# Patient Record
Sex: Female | Born: 1960
Health system: Southern US, Community
[De-identification: ages and names within clinical notes are randomized; demographics above are authoritative.]

## PROBLEM LIST (undated history)

## (undated) DIAGNOSIS — I10 Essential (primary) hypertension: Secondary | ICD-10-CM

## (undated) DIAGNOSIS — F419 Anxiety disorder, unspecified: Secondary | ICD-10-CM

## (undated) DIAGNOSIS — K602 Anal fissure, unspecified: Secondary | ICD-10-CM

## (undated) DIAGNOSIS — G5603 Carpal tunnel syndrome, bilateral upper limbs: Secondary | ICD-10-CM

## (undated) DIAGNOSIS — E785 Hyperlipidemia, unspecified: Secondary | ICD-10-CM

## (undated) DIAGNOSIS — T8859XA Other complications of anesthesia, initial encounter: Secondary | ICD-10-CM

## (undated) DIAGNOSIS — C541 Malignant neoplasm of endometrium: Secondary | ICD-10-CM

## (undated) HISTORY — DX: Carpal tunnel syndrome, bilateral upper limbs: G56.03

## (undated) HISTORY — DX: Anal fissure, unspecified: K60.2

## (undated) HISTORY — DX: Hyperlipidemia, unspecified: E78.5

## (undated) HISTORY — PX: CARPAL TUNNEL RELEASE: SHX101

---

## 2002-02-12 ENCOUNTER — Ambulatory Visit (HOSPITAL_COMMUNITY): Admission: RE | Admit: 2002-02-12 | Discharge: 2002-02-12 | Payer: Self-pay | Admitting: Obstetrics and Gynecology

## 2002-02-12 ENCOUNTER — Encounter: Payer: Self-pay | Admitting: Obstetrics and Gynecology

## 2003-02-18 ENCOUNTER — Encounter: Payer: Self-pay | Admitting: Obstetrics and Gynecology

## 2003-02-18 ENCOUNTER — Ambulatory Visit (HOSPITAL_COMMUNITY): Admission: RE | Admit: 2003-02-18 | Discharge: 2003-02-18 | Payer: Self-pay | Admitting: Obstetrics and Gynecology

## 2004-03-23 ENCOUNTER — Ambulatory Visit (HOSPITAL_COMMUNITY): Admission: RE | Admit: 2004-03-23 | Discharge: 2004-03-23 | Payer: Self-pay | Admitting: Obstetrics and Gynecology

## 2005-04-17 ENCOUNTER — Ambulatory Visit (HOSPITAL_COMMUNITY): Admission: RE | Admit: 2005-04-17 | Discharge: 2005-04-17 | Payer: Self-pay | Admitting: Family Medicine

## 2006-04-18 ENCOUNTER — Ambulatory Visit (HOSPITAL_COMMUNITY): Admission: RE | Admit: 2006-04-18 | Discharge: 2006-04-18 | Payer: Self-pay | Admitting: Family Medicine

## 2007-02-11 ENCOUNTER — Ambulatory Visit: Payer: Self-pay | Admitting: Internal Medicine

## 2007-05-05 ENCOUNTER — Ambulatory Visit (HOSPITAL_COMMUNITY): Admission: RE | Admit: 2007-05-05 | Discharge: 2007-05-05 | Payer: Self-pay | Admitting: Family Medicine

## 2007-05-20 ENCOUNTER — Ambulatory Visit (HOSPITAL_COMMUNITY): Admission: RE | Admit: 2007-05-20 | Discharge: 2007-05-20 | Payer: Self-pay | Admitting: Gastroenterology

## 2007-05-20 ENCOUNTER — Ambulatory Visit: Payer: Self-pay | Admitting: Gastroenterology

## 2007-05-20 HISTORY — PX: COLONOSCOPY: SHX174

## 2008-01-02 ENCOUNTER — Ambulatory Visit: Payer: Self-pay | Admitting: Gastroenterology

## 2008-02-17 ENCOUNTER — Ambulatory Visit: Payer: Self-pay | Admitting: Gastroenterology

## 2008-04-15 ENCOUNTER — Ambulatory Visit: Payer: Self-pay | Admitting: Gastroenterology

## 2008-05-13 ENCOUNTER — Ambulatory Visit (HOSPITAL_COMMUNITY): Admission: RE | Admit: 2008-05-13 | Discharge: 2008-05-13 | Payer: Self-pay | Admitting: Family Medicine

## 2009-05-11 ENCOUNTER — Encounter: Payer: Self-pay | Admitting: Gastroenterology

## 2009-05-24 ENCOUNTER — Ambulatory Visit (HOSPITAL_COMMUNITY): Admission: RE | Admit: 2009-05-24 | Discharge: 2009-05-24 | Payer: Self-pay | Admitting: Family Medicine

## 2010-05-22 ENCOUNTER — Encounter (INDEPENDENT_AMBULATORY_CARE_PROVIDER_SITE_OTHER): Payer: Self-pay | Admitting: *Deleted

## 2010-05-25 ENCOUNTER — Ambulatory Visit (HOSPITAL_COMMUNITY): Admission: RE | Admit: 2010-05-25 | Discharge: 2010-05-25 | Payer: Self-pay | Admitting: Family Medicine

## 2010-06-12 ENCOUNTER — Ambulatory Visit: Payer: Self-pay | Admitting: Gastroenterology

## 2010-06-12 DIAGNOSIS — K602 Anal fissure, unspecified: Secondary | ICD-10-CM

## 2010-11-28 NOTE — Assessment & Plan Note (Signed)
Summary: ANAL FISSURE   Visit Type:  Follow-up Visit Primary Care Yalissa Fink:  Regino Schultze, M.D.  Chief Complaint:  RECTAL PAIN.  History of Present Illness: Doing a lot better with the fisuure: itching or irritated. Last week kinda constipated-skipped a whole day and had a hard stool and bleeding a litttle. NTG helps and uses as needed. Rx since 2009. Getting better. Done really well. Uses dicyclomine 1-2 times a day. If she misses a dose, she does notice a difference.  Uses Sitz.  Current Medications (verified): 1)  Dicyclomine Hcl 10 Mg Caps (Dicyclomine Hcl) .... One By Mouth Two Times A Day Prn 2)  Hydrochlorothiazide 25 Mg Tabs (Hydrochlorothiazide) .... Take 1 Tablet By Mouth Once A Day 3)  Lisinopril 10 Mg Tabs (Lisinopril) .... Take 1 Tablet By Mouth Once A Day 4)  Simvastatin 40 Mg Tabs (Simvastatin) .... Take 1 Tablet By Mouth Once A Day 5)  Aspirin 81 Mg Tbec (Aspirin) .... Take 1 Tablet By Mouth Once A Day 6)  Stool Softners .... Two Tablets At Night 7)  Hydrocodone .... As Needed 8)  Alprazolam 0.5 Mg Tabs (Alprazolam) .... As Needed  Allergies (verified): No Known Drug Allergies  Past History:  Past Medical History: RECTAL BLEEDING-ANAL FISSURE **TCS 2008: WNLS Hyperlipidemia Carpal Tunnel Syndrome  Past Surgical History: Carpal Tunnel Release  Family History: No FH of Colon Cancer or polyps  Social History: Married No bio children  Review of Systems       2008: 238 lbs  Vital Signs:  Patient profile:   50 year old female Height:      64 inches Weight:      232 pounds BMI:     39.97 Temp:     97.3 degrees F oral Pulse rate:   84 / minute BP sitting:   130 / 82  (left arm) Cuff size:   large  Vitals Entered By: Cloria Spring LPN (June 12, 2010 9:06 AM)  Physical Exam  General:  Well developed, well nourished, no acute distress. Head:  Normocephalic and atraumatic. Eyes:  PERRLA, no icterus. Mouth:  No deformity or lesions. Neck:  Supple; no  masses. Lungs:  Clear throughout to auscultation. Heart:  Regular rate and rhythm; no murmurs. Abdomen:  Soft, nontender and nondistended. Normal bowel sounds.  Impression & Recommendations:  Problem # 1:  ANAL FISSURE (ICD-565.0)  OPV in 24 mos. Will refill meds for 2 years w/o an appt. Refilled NTG ointment and Bentyl.  CC: PCP  Orders: Est. Patient Level II (81191) Prescriptions: NITROGLYCERIN 0.2% TOP two times a day as needed RECTAL PAIN  #1 x 11   Entered and Authorized by:   West Bali MD   Signed by:   West Bali MD on 06/12/2010   Method used:   Faxed to ...       [facility]* (retail)       [address]       [place], [place]  [address]       Ph: [phone]       Fax: [phone]   RxID:   [id number] DICYCLOMINE HCL 10 MG CAPS (DICYCLOMINE HCL) one by mouth two times a day  #60 x 11   Entered and Authorized by:   West Bali MD   Signed by:   West Bali MD on 06/12/2010   Method used:   Telephoned to [facility] [address] (retail)       [address]       [place], [place]  [address]       Ph: [phone]       Fax:    RxID:   [id number]   Appended Document: ANAL FISSURE 24 MONTH OPV IS IN THE COMPUTER

## 2010-11-28 NOTE — Letter (Signed)
Summary: Recall Office Visit  [facility]  [address]   [place], [place] [address]   Phone: [phone]  Fax: [phone]      May 22, 2010   [facility] [address] [place], [place]  [address] [birth year]   Dear [name],   According to our records, it is time for you to schedule a follow-up office visit with [place].   At your convenience, please call [phone] to schedule an office visit. If you have any questions, concerns, or feel that this letter is in error, we would appreciate your call.   Sincerely,    [name]  [facility] Ph: [phone]   Fax: [phone]  Appended Document: Recall Office Visit mailed pt a letter to contact us to make appt in order to further her refills

## 2011-03-13 NOTE — Assessment & Plan Note (Signed)
NAMEFAVEN, WATTERSON                CHART#:  [id number]   DATE:  02/17/2008                       DOB:  1961/01/01   REFERRING Sohil Timko:  Kirk Ruths, M.D.   PROBLEM LIST:  1. Rectal bleeding in 2008 likely secondary to anal fissure.  2. Rectal discomfort is likely secondary to anal fissure.  3. Hyperlipidemia.  4. Carpal tunnel syndrome status post carpal tunnel surgery.   SUBJECTIVE:  Ms. Madkins is a 50 year old female who was last seen in  March 2009.  She was having loose stools.  She was having frequent  rectal urgency.  She was also continuing to use her nitroglycerin in  rectal suppositories.  She states the gas is better and the fissures  are trying to heal.  She uses two Bentyl a day and the rumbling is  better.  Rarely does she have stomach pain.  She still has some soreness  around the rectum and slight pinkish discharge on the stool.  She  occasionally feels like she has spasm in her rectum.  She has only used  the nitroglycerin three times since her last visit.  She has used two  sets of suppositories since her last visit.  When she uses a sitz bath  she uses a blow dryer her on her rectum and then apply the nitroglycerin  ointment.  Her stools are more soft than not.  The Citrucel is not  causing any excess gas.   MEDICATIONS:  1. Lisinopril.  2. HCTZ.  3. Alprazolam.  4. Simvastatin.  5. Hydrocodone/APAP 7.5/650 as needed.  6. Stool softener daily.  7. Aspirin daily.  8. Hydrocortisone AC 25 mg suppositories as needed.  9. Nitroglycerin ointment to the rectum as needed.  10.Bentyl 10 mg twice daily.   OBJECTIVE:  VITAL SIGNS:  Weight 239 pounds (unchanged since March  2009), height 5 feet 4 inches, BMI 41.2 (morbidly obese).  Temperature  97.6, blood pressure 126/84, pulse 88.  GENERAL:  She is in no apparent  distress.  Alert and orient times four.  LUNGS:  Clear to auscultation bilaterally.CARDIOVASCULAR:  Regular  rhythm.  No murmur.  ABDOMEN:   Bowel sounds present, soft, nontender,  nondistended.   ASSESSMENT:  Ms. Mcanelly is a 50 year old female who may have anal  fissure which is causing rectal discomfort and intermittent rectal  bleeding.  She seems to have symptomatically improved with Citrucel and  Bentyl and decreasing her stool softener.  She continues to use  suppositories and nitroglycerin as needed.  Thank you for allowing me to  see Ms. Odekirk in consultation.  My recommendations follow.   RECOMMENDATIONS:  1. She should continue the Bentyl, Citrucel and stool softeners.  She      may use the suppositories nitroglycerin as needed.  2. She has follow-up appointment to see me in two months.  She      currently declined a surgical evaluation for now.  3. She is also given a prescription for donut pillow so that when she      is sitting for prolonged period of time her rectum can be more      comfortable.       Kassie Mends, M.D.  Electronically Signed     SM/MEDQ  D:  02/17/2008  T:  02/17/2008  Job:  [id number]  cc:   Kirk Ruths, M.D.

## 2011-03-13 NOTE — Op Note (Signed)
NAMEPATRECE, Victoria               ACCOUNT NO.:  [id number]   MEDICAL RECORD NO.:  [id number]          PATIENT TYPE:  AMB   LOCATION:  DAY                           FACILITY:  [facility]   PHYSICIAN:  Kassie Mends, M.D.      DATE OF BIRTH:  May 20, 1961   DATE OF PROCEDURE:  05/20/2007  DATE OF DISCHARGE:                               OPERATIVE REPORT   REFERRING PHYSICIAN:  Kirk Ruths, M.D.   PROCEDURE:  Colonoscopy.   INDICATION FOR EXAM:  [name] is a 50 year old female who presents  with rectal bleeding in April 2008.  She noticed a small volume of blood  on the stool and was also having pain in her rectum.  She has a  significant past medical history of constipation.  She was given Anusol  twice daily and nitroglycerin twice daily as needed.   FINDINGS:  1. Normal colon without evidence of polyps, masses, inflammatory      changes, diverticula or AVMs.  2. Normal retroflexed view of the rectum.   RECOMMENDATIONS:  1. Screening colonoscopy in 10 years because her first second-degree      relative had colon cancer at an age greater than 60.  2. High-fiber diet.  She is given a handout on high-fiber diet and      anal fissures.  3. Increase stool softener to twice daily to avoid hard bowel      movements and straining with bowel movements.  4. Drink 6-8 cups of water daily.   MEDICATIONS:  1. Demerol 125 mg IV.  2. Versed 5 mg IV.  3. Phenergan 12.5 mg IV.   PROCEDURE TECHNIQUE:  Physical exam was performed and informed consent  was obtained from the patient explaining the benefits, risks and  alternatives to the procedure.  The patient was connected to the monitor  and placed in the left lateral position.  Continuous oxygen was provided  by nasal cannula and IV medicine administered through an indwelling  cannula.  After administration of sedation and rectal exam, the  patient's rectum was intubated  and the scope was advanced under direct visualization sedation to  the  cecum.  The scope was removed slowly by careful examining the color,  texture, anatomy and integrity of the mucosa on the way out.  The  patient was recovered in endoscopy and discharged home in satisfactory  condition.      Kassie Mends, M.D.  Electronically Signed     SM/MEDQ  D:  05/20/2007  T:  05/20/2007  Job:  [id number]   cc:   Kirk Ruths, M.D.  Fax: [phone]

## 2011-03-13 NOTE — Assessment & Plan Note (Signed)
NAMEAVEAH, CASTELL                CHART#:  [id number]   DATE:  01/02/2008                       DOB:  February 18, 1961   REFERRING Jozeph Persing:  Dr. Karleen Hampshire   PROBLEM LIST:  1. Rectal bleeding in 2008 likely secondary to anal fissure.  2. Hyperlipidemia.  3. Carpal tunnel syndrome status post carpal tunnel surgery.   SUBJECTIVE:  Ms. Trefz is a 50 year old female who presents as a  return patient visit.  She was last seen by me in July of 2008.  She  presented with rectal bleeding, and a colonoscopy revealed no obvious  etiology for the rectal bleeding.  She states that her anal fissure  almost went away and then it recurred.  She was having lots of gas and  going to the bathroom and irritation.  It does not hurt stool to pass  her anus the first time but after the third time she gets burning and  itching.  Her stool is different.  Currently it is loose and little bits  at a time.  She has a significant amount of stress related to her  grandmother.  She has increased the fiber in her diet by eating fiber  bars.  She drinks a lot of water.  She has the urge to go to the  bathroom a lot.  She is not working right now.  She had not had any  suppositories in 3 to 4 days.  She used them as needed.  She is still  using the nitroglycerin ointment only 3 times a day but it gives her a  headache.  She is trying to exercise.   MEDICATIONS:  1. Lisinopril.  2. Hydrochlorothiazide.  3. Alprazolam.  4. Simvastatin.  5. Hydrocodone/acetaminophen.  6. Stool softener 2 a day.  7. Aspirin 81 mg daily.  8. Nystatin/Triamcinolone as needed.  9. Hydrocort AC 25 mg.  10.Rectal suppositories as needed.  11.Nitroglycerin ointment as needed.   OBJECTIVE:  Weight 240 pounds (unchanged since April of 2008).  Height 5  feet 4 inches.  BMI 41.2 (morbidly obese).  Temperature 98.1.  Blood  pressure 130/80.  Pulse 80.  GENERAL:  She is in no apparent distress.  Alert and oriented x4.  HEENT:   Atraumatic, normocephalic.  Pupils are equal and reactive to  light.  Mouth, no oral lesions. NECK:  Full range of motion and no  lymphadenopathy. LUNGS:  Clear to auscultation bilaterally.  CARDIOVASCULAR:  Regular rate.  No murmur, normal S1, S2. ABDOMEN:  Bowel sounds are present.  Soft, nontender, nondistended.  No rebound or  guarding.   ASSESSMENT:  Ms. Lemaster is a 50 year old female with intermittent  rectal discomfort and rectal bleeding.  She may have anal fissures or  hemorrhoids.  It responds to suppositories and nitroglycerin ointment.  Thank you for allowing me to see Ms. Coppess in consultation.  My  recommendations follow.   RECOMMENDATIONS:  1. She is to decrease her stool softener to 1 q.h.s.  She is to add      fiber supplement.  She is to add Bentyl 10 mg at 6 a.m.  If her      bowel movements are not improved after 1 week, then she is to      increase the Bentyl to twice a day at 6 a.m. and at  noon.  2. She may use the suppositories, nitroglycerin as needed.  3. She is to take Citrucel once a day for a week.  If she has no      bloating after 1 week, then she is to increase twice a day.  She is      given her discharge instructions in writing.  4. She has a return visit in 6 weeks.       Kassie Mends, M.D.  Electronically Signed     SM/MEDQ  D:  01/05/2008  T:  01/05/2008  Job:  [id number]   cc:   Kirk Ruths, M.D.

## 2011-03-13 NOTE — Assessment & Plan Note (Signed)
[name], [name]                CHART#:  [id number]   DATE:  04/15/2008                       DOB:  1961-09-10   REFERRING PHYSICIAN:  Kirk Rhodes, M.D.   PROBLEM LIST:  1. Rectal bleeding and discomfort likely secondary to anal fissure.      The differential diagnosis includes hemorrhoids.  2. Hyperlipidemia.  3. Carpal tunnel syndrome status post carpal tunnel surgery.   SUBJECTIVE:  The patient is a 50 year old female who presents as a  return patient visit.  She was last seen in April 2009.  She had a  colonoscopy in September 2008, which did not show any obvious evidence  of hemorrhoids.  She returned in March 2009, for rectal bleeding and was  thought to be due to an anal fissure.  She was asked to use stool  softeners and Bentyl, hydrocortisone suppositories and nitroglycerin  ointment as needed.  She is also asked to add fiber.  She was seen in  followup in April 2009, and her symptoms were improved with Citrucel,  Bentyl, and decrease in her stool softeners.  She was still requiring  the use of suppositories and nitroglycerin.   She presents today with improvement over the last 4 weeks.  Her bowel  movements are better.  Occasionally, she has some itching and stinging  in her rectum, but things are so much better.  She has only used  nitroglycerin 1 to 2 times in the last 4 weeks.  When she feels the  tightness, she uses it and that helps.  She is using the stool softeners  too at nighttime, but if her stools are too loose then she decreases  this to once a day.  She is still using her Bentyl twice a day.  She is  having one, sometimes two, normal sausage-like stools a day.  She is  using her Citrucel powder 1 to 2 times a day and this is not causing any  bloating.   MEDICATIONS:  1. Lisinopril.  2. HCTZ.  3. Alprazolam.  4. Simvastatin.  5. Hydrocodone/APAP.  6. Stool softener 2 nightly.  7. Aspirin 81 mg daily.  8. Nystatin/triamcinolone.  9.  Hydrocortisone AC suppositories 25 mg as needed.  10.Nitroglycerin ointment as needed per her rectum.  11.Bentyl 10 mg b.i.d.  12.Citrucel powder 1 to 2 times a day.   OBJECTIVE:   PHYSICAL EXAMINATION:  VITALS:  Weight 231 pounds (down 8 pounds since  April 2009), height 5 feet 4 inches, temperature 98.9, blood pressure  128/88, and pulse 88.  GENERAL:  She is in no apparent distress.  Alert and oriented x4.LUNGS:  Clear to auscultation bilaterally.CARDIOVASCULAR:  Regular rhythm  without murmur.  ABDOMEN:  Bowel sounds are present, soft, nontender, and nondistended.   ASSESSMENT:  This patient is a 50 year old female who likely has  constipation predominant irritable bowel syndrome, which has responded  nicely anticholinergics and fiber.  Thank you for allowing me to see  this patient in consultation.  My recommendations follow.   RECOMMENDATIONS:  She should continue the Bentyl, Citrucel, and  nitroglycerin ointment as needed and stool softeners.  She has a follow  up appointment to see me in 4 months.       Kassie Mends, M.D.  Electronically Signed     SM/MEDQ  D:  04/15/2008  T:  04/16/2008  Job:  [id number]   cc:   [name], M.D.

## 2011-03-16 NOTE — Consult Note (Signed)
NAME:  [name], [name] NO.:  [id number]   MEDICAL RECORD NO.:  [id number]                  PATIENT TYPE:   LOCATION:                                       FACILITY:  [facility]   PHYSICIAN:  Lionel December, M.D.                 DATE OF BIRTH:  27-Feb-1961   DATE OF CONSULTATION:  04/30/2003  DATE OF DISCHARGE:                                   CONSULTATION   REPORT TITLE:  GASTROENTEROLOGY CONSULTATION   CHIEF COMPLAINT:  Heme-positive stools.   HISTORY OF PRESENT ILLNESS:  [name] is a 50 year old white female who  presents to our office today stating that she had a gynecological exam on  April 09, 2003, by [name].  At that point in time, Hemoccult was  obtained and she was found to be heme positive.  She was followed up with  three Hemoccult cards which all returned positive.  She states that she is  also seeing a small amount of pinkish blood post bowel movements.  She also  complains of some occasional intermittent burning and itching to her rectal  area.  She has no known history of hemorrhoids.  This has been going on for  approximately the last 2-3 months.  She also reports daily bowel movements  which are soft and brown.  She denies any history of diarrhea or  constipation.   She does have a positive family history for her maternal grandmother with  colon carcinoma in her 103s.  She also complains of occasional heartburn and  reflux which is relieved nicely with Pepcid.  She denies any dysphagia or  odynophagia.  She denies any history of peptic ulcer disease.  She denies  any history of Crohn's or ulcerative colitis.   PAST MEDICAL HISTORY:  Elevated cholesterol.   MEDICATIONS:  1. Xanax 0.5 mg p.o. p.r.n.  2. Pepcid p.r.n.  3. Anti-inflammatory p.r.n. for carpal tunnel syndrome.   ALLERGIES:  No known drug allergies.   FAMILY HISTORY:  Positive for maternal grandmother with colon carcinoma  (70s);  otherwise, unremarkable.   She has two sisters and one brother  without health problems.   SOCIAL HISTORY:  She has been married x22 years.  She is nulliparous.  She  is employed by [employer] full time; however, they are closing down in  a few days.  She will be unemployed at that time point in time.  She has a  history of smoking 1/2 pack per day for the last ten years.  She denies any  alcohol or drug use.   REVIEW OF SYSTEMS:  CONSTITUTIONAL:  She reports stable weight, appetite and  energy level.  She denies any fever or chills.  CARDIOVASCULAR:  She denies  any shortness of breath or chest pain.  GASTROINTESTINAL:  See HPI.  SKIN:  She denies any jaundice or rash.   PHYSICAL EXAMINATION:  GENERAL:  Well-developed, well-nourished, white  female in no acute distress.  SKIN:  Warm, pink and dry without any lesions.  No jaundice.  HEENT:  Sclerae clear, nonicteric.  Conjunctivae pink.  Oropharynx pink and  moist without any lesions.  CHEST:  Heart regular, rate and rhythm with murmur.  LUNGS:  Clear to auscultation bilaterally.  ABDOMEN:  Obese.  Positive bowel sounds x 4.  Soft, nontender, nondistended.  No organomegaly.  EXTREMITIES:  Pedal pulses 2+.  No pedal edema.   ASSESSMENT:  [name] is a 50 year old white female who presents with  heme-positive stools.  She also has a history of intermittent hematochezia  of small volume.  Given her family history of colon cancer, I feel that it  would be most appropriate at this time to proceed with a colonoscopy.  Ms.  Rhodes was explained the procedure, the risks and the benefits and she  agrees with this plan.   PLAN:  1. Colonoscopy was explained to the patient and the patient agrees with this     plan at this time.  We will prophylactically give her antibiotics     secondary to her heart murmur.  2. She is encouraged to follow up with Dr. Regino Schultze regarding this heart     murmur which has not been worked up.  3. Will also attempt to obtain a  recent hemoglobin and hematocrit from Dr.     Edison Simon office as she believes that she has had one drawn recently.  If     not, we may consider getting a hemoglobin and hematocrit.  4. We will follow up postprocedure or sooner if needed.     Nicholas Lose, N.P.                 Lionel December, M.D.    KC/MEDQ  D:  04/30/2003  T:  04/30/2003  Job:  [id number]   cc:   Tilda Burrow, M.D.  [address] [place]  [place] [address]  Fax: [phone]   Kirk Ruths, M.D.  [address]  [place]  [place] [address]  Fax: [phone]

## 2011-04-11 ENCOUNTER — Ambulatory Visit: Payer: Self-pay | Admitting: Gastroenterology

## 2011-04-23 ENCOUNTER — Other Ambulatory Visit (HOSPITAL_COMMUNITY): Payer: Self-pay | Admitting: Family Medicine

## 2011-04-23 DIAGNOSIS — Z139 Encounter for screening, unspecified: Secondary | ICD-10-CM

## 2011-05-28 ENCOUNTER — Ambulatory Visit (HOSPITAL_COMMUNITY)
Admission: RE | Admit: 2011-05-28 | Discharge: 2011-05-28 | Disposition: A | Discharge status: Home / Self Care | Payer: Medicare PPO | Source: Ambulatory Visit | Attending: Family Medicine | Admitting: Family Medicine

## 2011-05-28 DIAGNOSIS — Z139 Encounter for screening, unspecified: Secondary | ICD-10-CM

## 2011-05-28 DIAGNOSIS — Z1231 Encounter for screening mammogram for malignant neoplasm of breast: Secondary | ICD-10-CM | POA: Insufficient documentation

## 2012-05-08 ENCOUNTER — Other Ambulatory Visit (HOSPITAL_COMMUNITY): Payer: Self-pay | Admitting: Family Medicine

## 2012-05-08 DIAGNOSIS — Z139 Encounter for screening, unspecified: Secondary | ICD-10-CM

## 2012-05-19 ENCOUNTER — Encounter: Payer: Self-pay | Admitting: Gastroenterology

## 2012-05-29 ENCOUNTER — Ambulatory Visit (HOSPITAL_COMMUNITY)
Admission: RE | Admit: 2012-05-29 | Discharge: 2012-05-29 | Disposition: A | Discharge status: Home / Self Care | Payer: Medicare PPO | Source: Ambulatory Visit | Attending: Family Medicine | Admitting: Family Medicine

## 2012-05-29 DIAGNOSIS — Z1231 Encounter for screening mammogram for malignant neoplasm of breast: Secondary | ICD-10-CM | POA: Insufficient documentation

## 2012-05-29 DIAGNOSIS — Z139 Encounter for screening, unspecified: Secondary | ICD-10-CM

## 2012-06-03 ENCOUNTER — Other Ambulatory Visit: Payer: Self-pay | Admitting: Family Medicine

## 2012-06-03 DIAGNOSIS — R928 Other abnormal and inconclusive findings on diagnostic imaging of breast: Secondary | ICD-10-CM

## 2012-06-11 ENCOUNTER — Other Ambulatory Visit: Payer: Self-pay | Admitting: Family Medicine

## 2012-06-11 ENCOUNTER — Ambulatory Visit (HOSPITAL_COMMUNITY)
Admission: RE | Admit: 2012-06-11 | Discharge: 2012-06-11 | Disposition: A | Discharge status: Home / Self Care | Payer: Medicare PPO | Source: Ambulatory Visit | Attending: Family Medicine | Admitting: Family Medicine

## 2012-06-11 DIAGNOSIS — R928 Other abnormal and inconclusive findings on diagnostic imaging of breast: Secondary | ICD-10-CM | POA: Insufficient documentation

## 2012-06-11 DIAGNOSIS — R921 Mammographic calcification found on diagnostic imaging of breast: Secondary | ICD-10-CM

## 2012-06-18 ENCOUNTER — Ambulatory Visit
Admission: RE | Admit: 2012-06-18 | Discharge: 2012-06-18 | Disposition: A | Discharge status: Home / Self Care | Payer: Medicare PPO | Source: Ambulatory Visit | Attending: Family Medicine | Admitting: Family Medicine

## 2012-06-18 DIAGNOSIS — R921 Mammographic calcification found on diagnostic imaging of breast: Secondary | ICD-10-CM

## 2012-06-23 HISTORY — PX: BREAST BIOPSY: SHX20

## 2013-03-03 ENCOUNTER — Encounter: Payer: Self-pay | Admitting: Gastroenterology

## 2013-03-05 ENCOUNTER — Ambulatory Visit (INDEPENDENT_AMBULATORY_CARE_PROVIDER_SITE_OTHER): Payer: Medicare PPO | Admitting: Gastroenterology

## 2013-03-05 ENCOUNTER — Encounter: Payer: Self-pay | Admitting: Gastroenterology

## 2013-03-05 VITALS — BP 138/84 | HR 96 | Temp 98.2°F | Ht 64.0 in | Wt 247.2 lb

## 2013-03-05 DIAGNOSIS — K602 Anal fissure, unspecified: Secondary | ICD-10-CM

## 2013-03-05 MED ORDER — DICYCLOMINE HCL 10 MG PO CAPS: ORAL_CAPSULE | ORAL | Status: DC

## 2013-03-05 MED ORDER — NYSTATIN 100000 UNIT/GM EX POWD: Freq: Four times a day (QID) | CUTANEOUS | Status: DC

## 2013-03-05 NOTE — Progress Notes (Signed)
CC PCP 

## 2013-03-05 NOTE — Assessment & Plan Note (Signed)
SX FLARE WITH INTERMITTENT CHANGE IN BOWEL HABITS. WATERY STOOLS RESPOND TO DICYCLOMINE. RECTAL ITCHING MAY BE DUE TO YEAST OVERGROWTH OR MILD FLARE OF HEMORRHOIDS.  DRINK WATER TO KEEP YOUR URINE LIGHT YELLOW. EAT FIBER NTG OINTMENT AS NEEDED DICYCLOMINE AS NEEDED FOR WATERY STOOLS COLACE 1-2 AT BEDTIME TO PREVENT CONSTIPATION NYSTATIN CREAM TO PERIANAL AREA AS NEEDED FOR RECTAL ITCHING OPV IN 3 MOS

## 2013-03-05 NOTE — Progress Notes (Signed)
Reminder in epic °

## 2013-03-05 NOTE — Patient Instructions (Signed)
DRINK WATER TO KEEP YOUR URINE LIGHT YELLOW. FOLLOW A HIGH FIBER/LOW FAT DIET.  AVOID ITEMS THAT CAUSE BLOATING & GAS. SEE INFO BELOW.   USE NITROGLYCERIN OINTMENT AS NEEDED USE DICYCLOMINE AS NEEDED FOR WATERY STOOLS. USE COLACE 1-2 AT BEDTIME TO PREVENT CONSTIPATION. NYSTATIN CREAM TO PERIANAL AREA AS NEEDED FOR RECTAL ITCHING  FOLLOW UP IN 3 MOS.    Low-Fat Diet BREADS, CEREALS, PASTA, RICE, DRIED PEAS, AND BEANS These products are high in carbohydrates and most are low in fat. Therefore, they can be increased in the diet as substitutes for fatty foods. They too, however, contain calories and should not be eaten in excess. Cereals can be eaten for snacks as well as for breakfast.   FRUITS AND VEGETABLES It is good to eat fruits and vegetables. Besides being sources of fiber, both are rich in vitamins and some minerals. They help you get the daily allowances of these nutrients. Fruits and vegetables can be used for snacks and desserts.  MEATS Limit lean meat, chicken, [place], and fish to no more than 6 ounces per day. Beef, Pork, and Lamb Use lean cuts of beef, pork, and lamb. Lean cuts include:  Extra-lean ground beef.  Arm roast.  Sirloin tip.  Center-cut ham.  Round steak.  Loin chops.  Rump roast.  Tenderloin.  Trim all fat off the outside of meats before cooking. It is not necessary to severely decrease the intake of red meat, but lean choices should be made. Lean meat is rich in protein and contains a highly absorbable form of iron. Premenopausal women, in particular, should avoid reducing lean red meat because this could increase the risk for low red blood cells (iron-deficiency anemia).  Chicken and [place] These are good sources of protein. The fat of poultry can be reduced by removing the skin and underlying fat layers before cooking. Chicken and [place] can be substituted for lean red meat in the diet. Poultry should not be fried or covered with high-fat sauces. Fish and  Shellfish Fish is a good source of protein. Shellfish contain cholesterol, but they usually are low in saturated fatty acids. The preparation of fish is important. Like chicken and [place], they should not be fried or covered with high-fat sauces. EGGS Egg whites contain no fat or cholesterol. They can be eaten often. Try 1 to 2 egg whites instead of whole eggs in recipes or use egg substitutes that do not contain yolk. MILK AND DAIRY PRODUCTS Use skim or 1% milk instead of 2% or whole milk. Decrease whole milk, natural, and processed cheeses. Use nonfat or low-fat (2%) cottage cheese or low-fat cheeses made from vegetable oils. Choose nonfat or low-fat (1 to 2%) yogurt. Experiment with evaporated skim milk in recipes that call for heavy cream. Substitute low-fat yogurt or low-fat cottage cheese for sour cream in dips and salad dressings. Have at least 2 servings of low-fat dairy products, such as 2 glasses of skim (or 1%) milk each day to help get your daily calcium intake. FATS AND OILS Reduce the total intake of fats, especially saturated fat. Butterfat, lard, and beef fats are high in saturated fat and cholesterol. These should be avoided as much as possible. Vegetable fats do not contain cholesterol, but certain vegetable fats, such as coconut oil, palm oil, and palm kernel oil are very high in saturated fats. These should be limited. These fats are often used in bakery goods, processed foods, popcorn, oils, and nondairy creamers. Vegetable shortenings and some peanut butters  contain hydrogenated oils, which are also saturated fats. Read the labels on these foods and check for saturated vegetable oils. Unsaturated vegetable oils and fats do not raise blood cholesterol. However, they should be limited because they are fats and are high in calories. Total fat should still be limited to 30% of your daily caloric intake. Desirable liquid vegetable oils are corn oil, cottonseed oil, olive oil, canola oil,  safflower oil, soybean oil, and sunflower oil. Peanut oil is not as good, but small amounts are acceptable. Buy a heart-healthy tub margarine that has no partially hydrogenated oils in the ingredients. Mayonnaise and salad dressings often are made from unsaturated fats, but they should also be limited because of their high calorie and fat content. Seeds, nuts, peanut butter, olives, and avocados are high in fat, but the fat is mainly the unsaturated type. These foods should be limited mainly to avoid excess calories and fat. OTHER EATING TIPS Snacks  Most sweets should be limited as snacks. They tend to be rich in calories and fats, and their caloric content outweighs their nutritional value. Some good choices in snacks are graham crackers, melba toast, soda crackers, bagels (no egg), [place] muffins, fruits, and vegetables. These snacks are preferable to snack crackers, [place] fries, TORTILLA CHIPS, and POTATO chips. Popcorn should be air-popped or cooked in small amounts of liquid vegetable oil. Desserts Eat fruit, low-fat yogurt, and fruit ices instead of pastries, cake, and cookies. Sherbet, angel food cake, gelatin dessert, frozen low-fat yogurt, or other frozen products that do not contain saturated fat (pure fruit juice bars, frozen ice pops) are also acceptable.  COOKING METHODS Choose those methods that use little or no fat. They include: Poaching.  Braising.  Steaming.  Grilling.  Baking.  Stir-frying.  Broiling.  Microwaving.  Foods can be cooked in a nonstick pan without added fat, or use a nonfat cooking spray in regular cookware. Limit fried foods and avoid frying in saturated fat. Add moisture to lean meats by using water, broth, cooking wines, and other nonfat or low-fat sauces along with the cooking methods mentioned above. Soups and stews should be chilled after cooking. The fat that forms on top after a few hours in the refrigerator should be skimmed off. When preparing meals,  avoid using excess salt. Salt can contribute to raising blood pressure in some people.  EATING AWAY FROM HOME Order entres, potatoes, and vegetables without sauces or butter. When meat exceeds the size of a deck of cards (3 to 4 ounces), the rest can be taken home for another meal. Choose vegetable or fruit salads and ask for low-calorie salad dressings to be served on the side. Use dressings sparingly. Limit high-fat toppings, such as bacon, crumbled eggs, cheese, sunflower seeds, and olives. Ask for heart-healthy tub margarine instead of butter.  High-Fiber Diet A high-fiber diet changes your normal diet to include more whole grains, legumes, fruits, and vegetables. Changes in the diet involve replacing refined carbohydrates with unrefined foods. The calorie level of the diet is essentially unchanged. The Dietary Reference Intake (recommended amount) for adult males is 38 grams per day. For adult females, it is 25 grams per day. Pregnant and lactating women should consume 28 grams of fiber per day. Fiber is the intact part of a plant that is not broken down during digestion. Functional fiber is fiber that has been isolated from the plant to provide a beneficial effect in the body. PURPOSE  Increase stool bulk.   Ease and regulate bowel  movements.   Lower cholesterol.  INDICATIONS THAT YOU NEED MORE FIBER  Constipation and hemorrhoids.   Uncomplicated diverticulosis (intestine condition) and irritable bowel syndrome.   Weight management.   As a protective measure against hardening of the arteries (atherosclerosis), diabetes, and cancer.   GUIDELINES FOR INCREASING FIBER IN THE DIET  Start adding fiber to the diet slowly. A gradual increase of about 5 more grams (2 slices of whole-wheat bread, 2 servings of most fruits or vegetables, or 1 bowl of high-fiber cereal) per day is best. Too rapid an increase in fiber may result in constipation, flatulence, and bloating.   Drink enough water  and fluids to keep your urine clear or pale yellow. Water, juice, or caffeine-free drinks are recommended. Not drinking enough fluid may cause constipation.   Eat a variety of high-fiber foods rather than one type of fiber.   Try to increase your intake of fiber through using high-fiber foods rather than fiber pills or supplements that contain small amounts of fiber.   The goal is to change the types of food eaten. Do not supplement your present diet with high-fiber foods, but replace foods in your present diet.  INCLUDE A VARIETY OF FIBER SOURCES  Replace refined and processed grains with whole grains, canned fruits with fresh fruits, and incorporate other fiber sources. White rice, white breads, and most bakery goods contain little or no fiber.   Brown whole-grain rice, buckwheat oats, and many fruits and vegetables are all good sources of fiber. These include: broccoli, Brussels sprouts, cabbage, cauliflower, beets, sweet potatoes, white potatoes (skin on), carrots, tomatoes, eggplant, squash, berries, fresh fruits, and dried fruits.   Cereals appear to be the richest source of fiber. Cereal fiber is found in whole grains and bran. Bran is the fiber-rich outer coat of cereal grain, which is largely removed in refining. In whole-grain cereals, the bran remains. In breakfast cereals, the largest amount of fiber is found in those with "bran" in their names. The fiber content is sometimes indicated on the label.   You may need to include additional fruits and vegetables each day.   In baking, for 1 cup white flour, you may use the following substitutions:   1 cup whole-wheat flour minus 2 tablespoons.   1/2 cup white flour plus 1/2 cup whole-wheat flour.

## 2013-03-05 NOTE — Progress Notes (Signed)
  Subjective:    Patient ID: [name], female    DOB: April 11, 1961, 52 y.o.   MRN: [id number]  PCP: [id number]  HPI FOR 3 DAYS HAVE BEEN HAVING 4-5 LIKE LITTLE PIECES AND MAY BE WATERY. IT IRRITATED HER ANUS. MAY HAVE RECTAL BLEEDING(2X/MO) AND AT TIMES IT'S FINE. NEEDS SOMETHING TO SETTLE STOMACH. HAS VARYING STOOL CONSISTENCY: #1-#7 STOOL. MAY HAVE BRBPR-STREAKS, FEW DROPS IN THE COMMODE. RARE RUMBLING IN ABD. RARE REFLUX WHEN SHE EATS SPICY FOODS.  PT DENIES FEVER, CHILLS,nausea, vomiting, melena, abd pain, problems swallowing, problems with sedation, or indigestion.  MOTHER PASSED LAST YEAR. FATHER PASSED 3 WEEKS AGO. FEELS DEPRESSED.  Past Medical History  Diagnosis Date  . Anal fissure   . Hyperlipidemia   . Carpal tunnel syndrome on both sides    Past Surgical History  Procedure Laterality Date  . Colonoscopy  05/20/2007    [id number] colon  . Carpal tunnel release Bilateral MAY/JUL 2004   No Known Allergies  Current Outpatient Prescriptions  Medication Sig Dispense Refill  . ALPRAZolam (XANAX) 0.5 MG tablet Take 0.5 mg by mouth at bedtime as needed.       Marland Kitchen aspirin 81 MG tablet Take 81 mg by mouth daily.      Marland Kitchen docusate sodium (COLACE) 100 MG capsule Take 100 mg by mouth 2 (two) times daily.      . hydrochlorothiazide (HYDRODIURIL) 25 MG tablet Take 25 mg by mouth daily.       Marland Kitchen HYDROcodone-acetaminophen (NORCO) 7.5-325 MG per tablet Take 1 tablet by mouth every 6 (six) hours as needed.       Marland Kitchen lisinopril (PRINIVIL,ZESTRIL) 10 MG tablet Take 10 mg by mouth daily.       . nitroGLYCERIN (NITROGLYN) 2 % ointment Place 0.5 inches onto the skin 3 (three) times daily.      . simvastatin (ZOCOR) 40 MG tablet Take 40 mg by mouth at bedtime.           Review of Systems     Objective:   Physical Exam  Vitals reviewed. Constitutional: She is oriented to person, place, and time. She appears well-nourished. No distress.  HENT:  Head: Normocephalic and atraumatic.   Mouth/Throat: Oropharynx is clear and moist. No oropharyngeal exudate.  Eyes: Pupils are equal, round, and reactive to light. No scleral icterus.  Neck: Normal range of motion. Neck supple.  Cardiovascular: Normal rate, regular rhythm and normal heart sounds.   Pulmonary/Chest: Effort normal and breath sounds normal. No respiratory distress.  Abdominal: Soft. Bowel sounds are normal. She exhibits no distension. There is no tenderness.  Musculoskeletal: She exhibits no edema.  Lymphadenopathy:    She has no cervical adenopathy.  Neurological: She is alert and oriented to person, place, and time.  NO FOCAL DEFICITS   Psychiatric:  SLIGHTLY DEPRESSED MOOD, NL AFFECT          Assessment & Plan:

## 2013-03-06 ENCOUNTER — Telehealth: Payer: Self-pay

## 2013-03-06 MED ORDER — NYSTATIN 100000 UNIT/GM EX CREA: TOPICAL_CREAM | CUTANEOUS | Status: DC

## 2013-03-06 NOTE — Telephone Encounter (Signed)
Pt called and said she was supposed to get Rx for Nystatin cream and it was for powder instead. She asked if Dr. Darrick Penna could send in the one for the cream instead. She is aware that Dr. Darrick Penna is doing procedures at the hospital and it might be later in the day.

## 2013-03-06 NOTE — Telephone Encounter (Signed)
PLEASE CALL PT. POWDER SHOULD WORK BETTER ON HER BOTTOM, BUT IF SHE WANTS CREAM I SENT A Rx FOR THAT AS WELL.

## 2013-03-06 NOTE — Telephone Encounter (Signed)
Called and informed pt.  

## 2013-04-29 ENCOUNTER — Other Ambulatory Visit (HOSPITAL_COMMUNITY): Payer: Self-pay | Admitting: Family Medicine

## 2013-04-29 DIAGNOSIS — Z139 Encounter for screening, unspecified: Secondary | ICD-10-CM

## 2013-06-01 ENCOUNTER — Ambulatory Visit (HOSPITAL_COMMUNITY)
Admission: RE | Admit: 2013-06-01 | Discharge: 2013-06-01 | Disposition: A | Discharge status: Home / Self Care | Payer: Medicare PPO | Source: Ambulatory Visit | Attending: Family Medicine | Admitting: Family Medicine

## 2013-06-01 DIAGNOSIS — Z139 Encounter for screening, unspecified: Secondary | ICD-10-CM

## 2013-06-01 DIAGNOSIS — Z1231 Encounter for screening mammogram for malignant neoplasm of breast: Secondary | ICD-10-CM | POA: Insufficient documentation

## 2014-05-18 ENCOUNTER — Other Ambulatory Visit (HOSPITAL_COMMUNITY): Payer: Self-pay | Admitting: Family Medicine

## 2014-05-18 DIAGNOSIS — Z1231 Encounter for screening mammogram for malignant neoplasm of breast: Secondary | ICD-10-CM

## 2014-06-03 ENCOUNTER — Ambulatory Visit (HOSPITAL_COMMUNITY)
Admission: RE | Admit: 2014-06-03 | Discharge: 2014-06-03 | Disposition: A | Discharge status: Home / Self Care | Payer: Medicare PPO | Source: Ambulatory Visit | Attending: Family Medicine | Admitting: Family Medicine

## 2014-06-03 DIAGNOSIS — Z1231 Encounter for screening mammogram for malignant neoplasm of breast: Secondary | ICD-10-CM | POA: Insufficient documentation

## 2014-11-16 DIAGNOSIS — Z6841 Body Mass Index (BMI) 40.0 and over, adult: Secondary | ICD-10-CM | POA: Diagnosis not present

## 2014-11-16 DIAGNOSIS — Z01419 Encounter for gynecological examination (general) (routine) without abnormal findings: Secondary | ICD-10-CM | POA: Diagnosis not present

## 2015-02-28 DIAGNOSIS — J0100 Acute maxillary sinusitis, unspecified: Secondary | ICD-10-CM | POA: Diagnosis not present

## 2015-02-28 DIAGNOSIS — Z6841 Body Mass Index (BMI) 40.0 and over, adult: Secondary | ICD-10-CM | POA: Diagnosis not present

## 2015-02-28 DIAGNOSIS — J302 Other seasonal allergic rhinitis: Secondary | ICD-10-CM | POA: Diagnosis not present

## 2015-03-01 DIAGNOSIS — H40053 Ocular hypertension, bilateral: Secondary | ICD-10-CM | POA: Diagnosis not present

## 2015-05-09 DIAGNOSIS — Z1389 Encounter for screening for other disorder: Secondary | ICD-10-CM | POA: Diagnosis not present

## 2015-05-09 DIAGNOSIS — E782 Mixed hyperlipidemia: Secondary | ICD-10-CM | POA: Diagnosis not present

## 2015-05-09 DIAGNOSIS — I10 Essential (primary) hypertension: Secondary | ICD-10-CM | POA: Diagnosis not present

## 2015-05-09 DIAGNOSIS — J309 Allergic rhinitis, unspecified: Secondary | ICD-10-CM | POA: Diagnosis not present

## 2015-05-09 DIAGNOSIS — Z6841 Body Mass Index (BMI) 40.0 and over, adult: Secondary | ICD-10-CM | POA: Diagnosis not present

## 2015-05-09 DIAGNOSIS — F419 Anxiety disorder, unspecified: Secondary | ICD-10-CM | POA: Diagnosis not present

## 2015-05-10 ENCOUNTER — Other Ambulatory Visit (HOSPITAL_COMMUNITY): Payer: Self-pay | Admitting: Internal Medicine

## 2015-05-10 DIAGNOSIS — Z1231 Encounter for screening mammogram for malignant neoplasm of breast: Secondary | ICD-10-CM

## 2015-06-06 ENCOUNTER — Ambulatory Visit (HOSPITAL_COMMUNITY): Payer: Medicare PPO

## 2015-06-08 ENCOUNTER — Ambulatory Visit (HOSPITAL_COMMUNITY): Payer: Medicare PPO

## 2015-06-09 ENCOUNTER — Ambulatory Visit (HOSPITAL_COMMUNITY)
Admission: RE | Admit: 2015-06-09 | Discharge: 2015-06-09 | Disposition: A | Discharge status: Home / Self Care | Payer: Medicare PPO | Source: Ambulatory Visit | Attending: Internal Medicine | Admitting: Internal Medicine

## 2015-06-09 DIAGNOSIS — Z1231 Encounter for screening mammogram for malignant neoplasm of breast: Secondary | ICD-10-CM | POA: Diagnosis not present

## 2015-08-04 DIAGNOSIS — Z23 Encounter for immunization: Secondary | ICD-10-CM | POA: Diagnosis not present

## 2015-12-28 DIAGNOSIS — E782 Mixed hyperlipidemia: Secondary | ICD-10-CM | POA: Diagnosis not present

## 2015-12-28 DIAGNOSIS — H6593 Unspecified nonsuppurative otitis media, bilateral: Secondary | ICD-10-CM | POA: Diagnosis not present

## 2015-12-28 DIAGNOSIS — Z6841 Body Mass Index (BMI) 40.0 and over, adult: Secondary | ICD-10-CM | POA: Diagnosis not present

## 2015-12-28 DIAGNOSIS — I10 Essential (primary) hypertension: Secondary | ICD-10-CM | POA: Diagnosis not present

## 2015-12-28 DIAGNOSIS — J019 Acute sinusitis, unspecified: Secondary | ICD-10-CM | POA: Diagnosis not present

## 2015-12-28 DIAGNOSIS — Z1389 Encounter for screening for other disorder: Secondary | ICD-10-CM | POA: Diagnosis not present

## 2016-06-12 ENCOUNTER — Other Ambulatory Visit (HOSPITAL_COMMUNITY): Payer: Self-pay | Admitting: Internal Medicine

## 2016-06-12 DIAGNOSIS — Z1231 Encounter for screening mammogram for malignant neoplasm of breast: Secondary | ICD-10-CM

## 2016-07-16 ENCOUNTER — Ambulatory Visit (HOSPITAL_COMMUNITY): Payer: Medicare PPO

## 2016-07-19 ENCOUNTER — Ambulatory Visit (HOSPITAL_COMMUNITY)
Admission: RE | Admit: 2016-07-19 | Discharge: 2016-07-19 | Disposition: A | Discharge status: Home / Self Care | Payer: Medicare PPO | Source: Ambulatory Visit | Attending: Internal Medicine | Admitting: Internal Medicine

## 2016-07-19 ENCOUNTER — Ambulatory Visit: Payer: Medicare PPO

## 2016-07-19 DIAGNOSIS — Z1231 Encounter for screening mammogram for malignant neoplasm of breast: Secondary | ICD-10-CM | POA: Diagnosis not present

## 2016-07-30 DIAGNOSIS — H40053 Ocular hypertension, bilateral: Secondary | ICD-10-CM | POA: Diagnosis not present

## 2016-08-02 DIAGNOSIS — I10 Essential (primary) hypertension: Secondary | ICD-10-CM | POA: Diagnosis not present

## 2016-08-02 DIAGNOSIS — Z6841 Body Mass Index (BMI) 40.0 and over, adult: Secondary | ICD-10-CM | POA: Diagnosis not present

## 2016-08-02 DIAGNOSIS — Z1389 Encounter for screening for other disorder: Secondary | ICD-10-CM | POA: Diagnosis not present

## 2016-08-02 DIAGNOSIS — E782 Mixed hyperlipidemia: Secondary | ICD-10-CM | POA: Diagnosis not present

## 2016-08-02 DIAGNOSIS — F419 Anxiety disorder, unspecified: Secondary | ICD-10-CM | POA: Diagnosis not present

## 2016-09-03 DIAGNOSIS — E748 Other specified disorders of carbohydrate metabolism: Secondary | ICD-10-CM | POA: Diagnosis not present

## 2016-09-03 DIAGNOSIS — D72829 Elevated white blood cell count, unspecified: Secondary | ICD-10-CM | POA: Diagnosis not present

## 2016-09-03 DIAGNOSIS — E871 Hypo-osmolality and hyponatremia: Secondary | ICD-10-CM | POA: Diagnosis not present

## 2017-01-22 DIAGNOSIS — E748 Other specified disorders of carbohydrate metabolism: Secondary | ICD-10-CM | POA: Diagnosis not present

## 2017-01-23 DIAGNOSIS — Z6838 Body mass index (BMI) 38.0-38.9, adult: Secondary | ICD-10-CM | POA: Diagnosis not present

## 2017-01-23 DIAGNOSIS — Z1389 Encounter for screening for other disorder: Secondary | ICD-10-CM | POA: Diagnosis not present

## 2017-01-23 DIAGNOSIS — E748 Other specified disorders of carbohydrate metabolism: Secondary | ICD-10-CM | POA: Diagnosis not present

## 2017-01-23 DIAGNOSIS — E782 Mixed hyperlipidemia: Secondary | ICD-10-CM | POA: Diagnosis not present

## 2017-01-23 DIAGNOSIS — F419 Anxiety disorder, unspecified: Secondary | ICD-10-CM | POA: Diagnosis not present

## 2017-01-23 DIAGNOSIS — E669 Obesity, unspecified: Secondary | ICD-10-CM | POA: Diagnosis not present

## 2017-01-23 DIAGNOSIS — I10 Essential (primary) hypertension: Secondary | ICD-10-CM | POA: Diagnosis not present

## 2017-02-12 DIAGNOSIS — J209 Acute bronchitis, unspecified: Secondary | ICD-10-CM | POA: Diagnosis not present

## 2017-02-12 DIAGNOSIS — J069 Acute upper respiratory infection, unspecified: Secondary | ICD-10-CM | POA: Diagnosis not present

## 2017-02-12 DIAGNOSIS — Z6838 Body mass index (BMI) 38.0-38.9, adult: Secondary | ICD-10-CM | POA: Diagnosis not present

## 2017-03-28 ENCOUNTER — Telehealth: Payer: Self-pay | Admitting: Gastroenterology

## 2017-03-28 NOTE — Telephone Encounter (Signed)
RECALL FOR TCS °

## 2017-03-28 NOTE — Telephone Encounter (Signed)
Letter mailed

## 2017-04-15 ENCOUNTER — Telehealth: Payer: Self-pay

## 2017-04-15 NOTE — Telephone Encounter (Signed)
PT wants to wait until October for colonoscopy. She will call me by first of September to be triaged.

## 2017-06-14 DIAGNOSIS — Z6836 Body mass index (BMI) 36.0-36.9, adult: Secondary | ICD-10-CM | POA: Diagnosis not present

## 2017-06-14 DIAGNOSIS — N390 Urinary tract infection, site not specified: Secondary | ICD-10-CM | POA: Diagnosis not present

## 2017-06-14 DIAGNOSIS — J019 Acute sinusitis, unspecified: Secondary | ICD-10-CM | POA: Diagnosis not present

## 2017-06-18 ENCOUNTER — Other Ambulatory Visit (HOSPITAL_COMMUNITY): Payer: Self-pay | Admitting: Internal Medicine

## 2017-06-18 DIAGNOSIS — Z1231 Encounter for screening mammogram for malignant neoplasm of breast: Secondary | ICD-10-CM

## 2017-07-02 ENCOUNTER — Telehealth: Payer: Self-pay

## 2017-07-02 NOTE — Telephone Encounter (Signed)
Pt called at 0800 this morning asking for DS to schedule her colonoscopy. I told her that DS does her Triages in the afternoons from 130 to 430. Please call her at [phone] or [phone]

## 2017-07-02 NOTE — Telephone Encounter (Signed)
See separate triage.  

## 2017-07-03 NOTE — Telephone Encounter (Signed)
Phenergan 25 mg IV on call.

## 2017-07-03 NOTE — Telephone Encounter (Signed)
Gastroenterology Pre-Procedure Review  Request Date: 07/02/2017 Requesting Physician: ON RECALL   Last colonoscopy 05/20/2007 by Dr. Stann Mainland ( [facility])  PATIENT REVIEW QUESTIONS: The patient responded to the following health history questions as indicated:    1. Diabetes Melitis: no 2. Joint replacements in the past 12 months: no 3. Major health problems in the past 3 months: no 4. Has an artificial valve or MVP: no 5. Has a defibrillator: no 6. Has been advised in past to take antibiotics in advance of a procedure like teeth cleaning: no 7. Family history of colon cancer: YES   First degree at age greater than 38 8. Alcohol Use: no  9. History of sleep apnea: no  10. History of coronary artery or other vascular stents placed within the last 12 months: no 11. History of any prior anesthesia complications: no    MEDICATIONS & ALLERGIES:    Patient reports the following regarding taking any blood thinners:   Plavix? no Aspirin? YES Coumadin? no Brilinta? no Xarelto? no Eliquis? no Pradaxa? no Savaysa? no Effient? no  Patient confirms/reports the following medications:  Current Outpatient Prescriptions  Medication Sig Dispense Refill  . ALPRAZolam (XANAX) 0.5 MG tablet Take 0.5 mg by mouth at bedtime as needed.     Marland Kitchen aspirin 81 MG tablet Take 81 mg by mouth daily.    Marland Kitchen ezetimibe (ZETIA) 10 MG tablet Take 10 mg by mouth daily.    . hydrochlorothiazide (HYDRODIURIL) 25 MG tablet Take 25 mg by mouth daily.     Marland Kitchen lisinopril (PRINIVIL,ZESTRIL) 10 MG tablet Take 10 mg by mouth daily.     . simvastatin (ZOCOR) 40 MG tablet Take 40 mg by mouth at bedtime.     . docusate sodium (COLACE) 100 MG capsule Take 100 mg by mouth 2 (two) times daily.    . nitroGLYCERIN (NITROGLYN) 2 % ointment Place 0.5 inches onto the skin 3 (three) times daily.    Marland Kitchen nystatin (MYCOSTATIN) powder Apply topically 4 (four) times daily. TO PERI-ANAL REGION AS NEEDED FOR RECTALITCHING (Patient not taking: Reported on  07/02/2017) 15 g 1  . nystatin cream (MYCOSTATIN) TOP QID PRN RECTAL ITCHING (Patient not taking: Reported on 07/02/2017) 30 g 0   No current facility-administered medications for this visit.     Patient confirms/reports the following allergies:  No Known Allergies  No orders of the defined types were placed in this encounter.   AUTHORIZATION INFORMATION Primary Insurance:   ID #:   Group #:  Pre-Cert / Auth required:  Pre-Cert / Auth #:   Secondary Insurance:   ID #:   Group #:  Pre-Cert / Auth required:  Pre-Cert / Auth #:   SCHEDULE INFORMATION: Procedure has been scheduled as follows:  Date:              Time:   Location:   This Gastroenterology Pre-Precedure Review Form is being routed to the following provider(s): Barney Drain, MD

## 2017-07-04 NOTE — Telephone Encounter (Signed)
Pt wants Nov appt. I have her on my call list.

## 2017-07-10 DIAGNOSIS — N342 Other urethritis: Secondary | ICD-10-CM | POA: Diagnosis not present

## 2017-07-10 DIAGNOSIS — E6609 Other obesity due to excess calories: Secondary | ICD-10-CM | POA: Diagnosis not present

## 2017-07-10 DIAGNOSIS — R350 Frequency of micturition: Secondary | ICD-10-CM | POA: Diagnosis not present

## 2017-07-10 DIAGNOSIS — Z6835 Body mass index (BMI) 35.0-35.9, adult: Secondary | ICD-10-CM | POA: Diagnosis not present

## 2017-07-10 DIAGNOSIS — Z1389 Encounter for screening for other disorder: Secondary | ICD-10-CM | POA: Diagnosis not present

## 2017-07-22 ENCOUNTER — Ambulatory Visit (HOSPITAL_COMMUNITY): Payer: Medicare PPO

## 2017-07-22 ENCOUNTER — Ambulatory Visit (HOSPITAL_COMMUNITY)
Admission: RE | Admit: 2017-07-22 | Discharge: 2017-07-22 | Disposition: A | Discharge status: Home / Self Care | Payer: Medicare PPO | Source: Ambulatory Visit | Attending: Internal Medicine | Admitting: Internal Medicine

## 2017-07-22 DIAGNOSIS — R39191 Need to immediately re-void: Secondary | ICD-10-CM | POA: Diagnosis not present

## 2017-07-22 DIAGNOSIS — Z6835 Body mass index (BMI) 35.0-35.9, adult: Secondary | ICD-10-CM | POA: Diagnosis not present

## 2017-07-22 DIAGNOSIS — Z1231 Encounter for screening mammogram for malignant neoplasm of breast: Secondary | ICD-10-CM | POA: Diagnosis not present

## 2017-07-22 DIAGNOSIS — Z23 Encounter for immunization: Secondary | ICD-10-CM | POA: Diagnosis not present

## 2017-07-22 DIAGNOSIS — E6609 Other obesity due to excess calories: Secondary | ICD-10-CM | POA: Diagnosis not present

## 2017-08-06 ENCOUNTER — Other Ambulatory Visit: Payer: Self-pay

## 2017-08-06 DIAGNOSIS — Z1211 Encounter for screening for malignant neoplasm of colon: Secondary | ICD-10-CM

## 2017-08-06 MED ORDER — PEG 3350-KCL-NA BICARB-NACL 420 G PO SOLR: 4000.0000 mL | ORAL | 0 refills | Status: DC

## 2017-08-06 NOTE — Telephone Encounter (Signed)
Pt has been scheduled for 09/13/2017 at 10:30 Am with Dr. Oneida Alar. She would like the cheaper prep. Rx sent to the pharmacy and instructions mailed to pt.

## 2017-08-06 NOTE — Addendum Note (Signed)
Addended by: Everardo All on: 08/06/2017 01:58 PM   Modules accepted: Orders

## 2017-08-15 DIAGNOSIS — N3289 Other specified disorders of bladder: Secondary | ICD-10-CM | POA: Diagnosis not present

## 2017-08-15 DIAGNOSIS — Z01411 Encounter for gynecological examination (general) (routine) with abnormal findings: Secondary | ICD-10-CM | POA: Diagnosis not present

## 2017-08-15 DIAGNOSIS — Z01419 Encounter for gynecological examination (general) (routine) without abnormal findings: Secondary | ICD-10-CM | POA: Diagnosis not present

## 2017-08-15 DIAGNOSIS — Z1389 Encounter for screening for other disorder: Secondary | ICD-10-CM | POA: Diagnosis not present

## 2017-08-15 DIAGNOSIS — E6609 Other obesity due to excess calories: Secondary | ICD-10-CM | POA: Diagnosis not present

## 2017-08-15 DIAGNOSIS — Z6835 Body mass index (BMI) 35.0-35.9, adult: Secondary | ICD-10-CM | POA: Diagnosis not present

## 2017-08-19 DIAGNOSIS — I10 Essential (primary) hypertension: Secondary | ICD-10-CM | POA: Diagnosis not present

## 2017-08-19 DIAGNOSIS — E782 Mixed hyperlipidemia: Secondary | ICD-10-CM | POA: Diagnosis not present

## 2017-09-13 ENCOUNTER — Encounter (HOSPITAL_COMMUNITY)
Admission: RE | Disposition: A | Discharge status: Home / Self Care | Payer: Self-pay | Source: Ambulatory Visit | Attending: Gastroenterology

## 2017-09-13 ENCOUNTER — Other Ambulatory Visit: Payer: Self-pay

## 2017-09-13 ENCOUNTER — Ambulatory Visit (HOSPITAL_COMMUNITY)
Admission: RE | Admit: 2017-09-13 | Discharge: 2017-09-13 | Disposition: A | Discharge status: Home / Self Care | Payer: Medicare PPO | Source: Ambulatory Visit | Attending: Gastroenterology | Admitting: Gastroenterology

## 2017-09-13 ENCOUNTER — Encounter (HOSPITAL_COMMUNITY): Payer: Self-pay | Admitting: *Deleted

## 2017-09-13 DIAGNOSIS — I10 Essential (primary) hypertension: Secondary | ICD-10-CM | POA: Insufficient documentation

## 2017-09-13 DIAGNOSIS — D123 Benign neoplasm of transverse colon: Secondary | ICD-10-CM | POA: Insufficient documentation

## 2017-09-13 DIAGNOSIS — Z1211 Encounter for screening for malignant neoplasm of colon: Secondary | ICD-10-CM | POA: Diagnosis not present

## 2017-09-13 DIAGNOSIS — Z87891 Personal history of nicotine dependence: Secondary | ICD-10-CM | POA: Insufficient documentation

## 2017-09-13 DIAGNOSIS — K635 Polyp of colon: Secondary | ICD-10-CM

## 2017-09-13 DIAGNOSIS — Z800 Family history of malignant neoplasm of digestive organs: Secondary | ICD-10-CM | POA: Diagnosis not present

## 2017-09-13 DIAGNOSIS — Q438 Other specified congenital malformations of intestine: Secondary | ICD-10-CM | POA: Insufficient documentation

## 2017-09-13 DIAGNOSIS — Z7982 Long term (current) use of aspirin: Secondary | ICD-10-CM | POA: Diagnosis not present

## 2017-09-13 DIAGNOSIS — Z1212 Encounter for screening for malignant neoplasm of rectum: Secondary | ICD-10-CM | POA: Diagnosis not present

## 2017-09-13 DIAGNOSIS — E785 Hyperlipidemia, unspecified: Secondary | ICD-10-CM | POA: Diagnosis not present

## 2017-09-13 DIAGNOSIS — Z79899 Other long term (current) drug therapy: Secondary | ICD-10-CM | POA: Diagnosis not present

## 2017-09-13 HISTORY — PX: COLONOSCOPY: SHX5424

## 2017-09-13 HISTORY — DX: Essential (primary) hypertension: I10

## 2017-09-13 SURGERY — COLONOSCOPY
Anesthesia: Moderate Sedation

## 2017-09-13 MED ORDER — MEPERIDINE HCL 100 MG/ML IJ SOLN
INTRAMUSCULAR | Status: DC | PRN
  Administered 2017-09-13 (×2): 50 mg via INTRAVENOUS

## 2017-09-13 MED ORDER — PROMETHAZINE HCL 25 MG/ML IJ SOLN
INTRAMUSCULAR | Status: AC
  Filled 2017-09-13: qty 1

## 2017-09-13 MED ORDER — SODIUM CHLORIDE 0.9 % IV SOLN
INTRAVENOUS | Status: DC
  Administered 2017-09-13: 1000 mL via INTRAVENOUS

## 2017-09-13 MED ORDER — MEPERIDINE HCL 100 MG/ML IJ SOLN
INTRAMUSCULAR | Status: DC
Start: 2017-09-13 — End: 2017-09-13
  Filled 2017-09-13: qty 2

## 2017-09-13 MED ORDER — MIDAZOLAM HCL 5 MG/5ML IJ SOLN
INTRAMUSCULAR | Status: DC | PRN
  Administered 2017-09-13 (×2): 2 mg via INTRAVENOUS
  Administered 2017-09-13: 1 mg via INTRAVENOUS

## 2017-09-13 MED ORDER — SODIUM CHLORIDE 0.9% FLUSH
INTRAVENOUS | Status: AC
  Filled 2017-09-13: qty 10

## 2017-09-13 MED ORDER — MIDAZOLAM HCL 5 MG/5ML IJ SOLN
INTRAMUSCULAR | Status: AC
  Filled 2017-09-13: qty 10

## 2017-09-13 MED ORDER — PROMETHAZINE HCL 25 MG/ML IJ SOLN
25.0000 mg | Freq: Once | INTRAMUSCULAR | Status: AC
  Administered 2017-09-13: 25 mg via INTRAVENOUS

## 2017-09-13 MED ORDER — MEPERIDINE HCL 50 MG/ML IJ SOLN
INTRAMUSCULAR | Status: AC
  Filled 2017-09-13: qty 1

## 2017-09-13 MED ORDER — STERILE WATER FOR IRRIGATION IR SOLN
Status: DC | PRN
  Administered 2017-09-13: 11:00:00

## 2017-09-13 NOTE — Discharge Instructions (Signed)
You had 1 polyp removed. I PLACED TWO CLIPS TO PREVENT BLEEDING IN 7-10 DAYS. You have  small internal AND EXTERNAL hemorrhoids.    IF YOU NEED AN MRI, LET THE RADIOLOGY TECH KNOW THAT YOU HAD TWO CLIPS PLACED IN YOUR COLON. THEY SHOULD FALL OFF IN 30 DAYS.   DRINK WATER TO KEEP YOUR URINE LIGHT YELLOW.  FOLLOW A HIGH FIBER DIET. AVOID ITEMS THAT CAUSE BLOATING & GAS. SEE INFO BELOW.   CONTINUE YOUR WEIGHT LOSS EFFORTS.  WHILE I DO NOT WANT TO ALARM YOU, YOUR BODY MASS INDEX(BMI) IS OVER 30 WHICH MEANS YOU ARE OBESE. OBESITY CAN ACTIVATE CANCER GENES. OBESITY IS ASSOCIATED WITH AN INCREASED RISK FOR CIRRHOSIS AND ALL CANCERS, INCLUDING ESOPHAGEAL AND COLON CANCER. A WEIGHT OF 165 LBS WILL GET YOUR BODY MASS INDEX(BMI) UNDER 30.  YOUR BIOPSY RESULTS WILL BE AVAILABLE IN MY CHART AFTER NOV 20 AND MY OFFICE WILL CONTACT YOU IN 10-14 DAYS WITH YOUR RESULTS.    USE PREPARATION H FOUR TIMES  A DAY IF NEEDED TO RELIEVE RECTAL PAIN/PRESSURE/BLEEDING. PLEASE CALL WITH QUESTIONS OR CONCERNS.  Next colonoscopy in 5-10 years.    Colonoscopy Care After Read the instructions outlined below and refer to this sheet in the next week. These discharge instructions provide you with general information on caring for yourself after you leave the hospital. While your treatment has been planned according to the most current medical practices available, unavoidable complications occasionally occur. If you have any problems or questions after discharge, call DR. Rhiannon Sassaman, [phone].  ACTIVITY  You may resume your regular activity, but move at a slower pace for the next 24 hours.   Take frequent rest periods for the next 24 hours.   Walking will help get rid of the air and reduce the bloated feeling in your belly (abdomen).   No driving for 24 hours (because of the medicine (anesthesia) used during the test).   You may shower.   Do not sign any important legal documents or operate any machinery for 24  hours (because of the anesthesia used during the test).    NUTRITION  Drink plenty of fluids.   You may resume your normal diet as instructed by your doctor.   Begin with a light meal and progress to your normal diet. Heavy or fried foods are harder to digest and may make you feel sick to your stomach (nauseated).   Avoid alcoholic beverages for 24 hours or as instructed.    MEDICATIONS  You may resume your normal medications.   WHAT YOU CAN EXPECT TODAY  Some feelings of bloating in the abdomen.   Passage of more gas than usual.   Spotting of blood in your stool or on the toilet paper  .  IF YOU HAD POLYPS REMOVED DURING THE COLONOSCOPY:  Eat a soft diet IF YOU HAVE NAUSEA, BLOATING, ABDOMINAL PAIN, OR VOMITING.    FINDING OUT THE RESULTS OF YOUR TEST Not all test results are available during your visit. DR. Oneida Alar WILL CALL YOU WITHIN 14 DAYS OF YOUR PROCEDUE WITH YOUR RESULTS. Do not assume everything is normal if you have not heard from DR. Adan Baehr, CALL HER OFFICE AT [phone].  SEEK IMMEDIATE MEDICAL ATTENTION AND CALL THE OFFICE: [phone] IF:  You have more than a spotting of blood in your stool.   Your belly is swollen (abdominal distention).   You are nauseated or vomiting.   You have a temperature over 101F.   You have abdominal pain or  discomfort that is severe or gets worse throughout the day.   High-Fiber Diet A high-fiber diet changes your normal diet to include more whole grains, legumes, fruits, and vegetables. Changes in the diet involve replacing refined carbohydrates with unrefined foods. The calorie level of the diet is essentially unchanged. The Dietary Reference Intake (recommended amount) for adult males is 38 grams per day. For adult females, it is 25 grams per day. Pregnant and lactating women should consume 28 grams of fiber per day. Fiber is the intact part of a plant that is not broken down during digestion. Functional fiber is  fiber that has been isolated from the plant to provide a beneficial effect in the body. PURPOSE  Increase stool bulk.   Ease and regulate bowel movements.   Lower cholesterol.   REDUCE RISK OF COLON CANCER  INDICATIONS THAT YOU NEED MORE FIBER  Constipation and hemorrhoids.   Uncomplicated diverticulosis (intestine condition) and irritable bowel syndrome.   Weight management.   As a protective measure against hardening of the arteries (atherosclerosis), diabetes, and cancer.   GUIDELINES FOR INCREASING FIBER IN THE DIET  Start adding fiber to the diet slowly. A gradual increase of about 5 more grams (2 slices of whole-wheat bread, 2 servings of most fruits or vegetables, or 1 bowl of high-fiber cereal) per day is best. Too rapid an increase in fiber may result in constipation, flatulence, and bloating.   Drink enough water and fluids to keep your urine clear or pale yellow. Water, juice, or caffeine-free drinks are recommended. Not drinking enough fluid may cause constipation.   Eat a variety of high-fiber foods rather than one type of fiber.   Try to increase your intake of fiber through using high-fiber foods rather than fiber pills or supplements that contain small amounts of fiber.   The goal is to change the types of food eaten. Do not supplement your present diet with high-fiber foods, but replace foods in your present diet.   INCLUDE A VARIETY OF FIBER SOURCES  Replace refined and processed grains with whole grains, canned fruits with fresh fruits, and incorporate other fiber sources. White rice, white breads, and most bakery goods contain little or no fiber.   Brown whole-grain rice, buckwheat oats, and many fruits and vegetables are all good sources of fiber. These include: broccoli, Brussels sprouts, cabbage, cauliflower, beets, sweet potatoes, white potatoes (skin on), carrots, tomatoes, eggplant, squash, berries, fresh fruits, and dried fruits.   Cereals appear to  be the richest source of fiber. Cereal fiber is found in whole grains and bran. Bran is the fiber-rich outer coat of cereal grain, which is largely removed in refining. In whole-grain cereals, the bran remains. In breakfast cereals, the largest amount of fiber is found in those with "bran" in their names. The fiber content is sometimes indicated on the label.   You may need to include additional fruits and vegetables each day.   In baking, for 1 cup white flour, you may use the following substitutions:   1 cup whole-wheat flour minus 2 tablespoons.   1/2 cup white flour plus 1/2 cup whole-wheat flour.   Polyps, Colon  A polyp is extra tissue that grows inside your body. Colon polyps grow in the large intestine. The large intestine, also called the colon, is part of your digestive system. It is a long, hollow tube at the end of your digestive tract where your body makes and stores stool. Most polyps are not dangerous. They are benign.  This means they are not cancerous. But over time, some types of polyps can turn into cancer. Polyps that are smaller than a pea are usually not harmful. But larger polyps could someday become or may already be cancerous. To be safe, doctors remove all polyps and test them.   WHO GETS POLYPS? Anyone can get polyps, but certain people are more likely than others. You may have a greater chance of getting polyps if:  You are over 50.   You have had polyps before.   Someone in your family has had polyps.   Someone in your family has had cancer of the large intestine.   Find out if someone in your family has had polyps. You may also be more likely to get polyps if you:   Eat a lot of fatty foods   Smoke   Drink alcohol   Do not exercise  Eat too much   PREVENTION There is not one sure way to prevent polyps. You might be able to lower your risk of getting them if you:  Eat more fruits and vegetables and less fatty food.   Do not smoke.   Avoid alcohol.     Exercise every day.   Lose weight if you are overweight.   Eating more calcium and folate can also lower your risk of getting polyps. Some foods that are rich in calcium are milk, cheese, and broccoli. Some foods that are rich in folate are chickpeas, kidney beans, and spinach.   Hemorrhoids Hemorrhoids are dilated (enlarged) veins around the rectum. Sometimes clots will form in the veins. This makes them swollen and painful. These are called thrombosed hemorrhoids. Causes of hemorrhoids include:  Constipation.   Straining to have a bowel movement.   HEAVY LIFTING  HOME CARE INSTRUCTIONS  Eat a well balanced diet and drink 6 to 8 glasses of water every day to avoid constipation. You may also use a bulk laxative.   Avoid straining to have bowel movements.   Keep anal area dry and clean.   Do not use a donut shaped pillow or sit on the toilet for long periods. This increases blood pooling and pain.   Move your bowels when your body has the urge; this will require less straining and will decrease pain and pressure.  Marland Kitchen

## 2017-09-13 NOTE — H&P (Signed)
Primary Care Physician:  Redmond School, MD Primary Gastroenterologist:  Dr. Oneida Alar  Pre-Procedure History & Physical: HPI:  [name] is a 56 y.o. female here for [facility].  Past Medical History:  Diagnosis Date  . Anal fissure   . Carpal tunnel syndrome on both sides   . Hyperlipidemia   . Hypertension     Past Surgical History:  Procedure Laterality Date  . CARPAL TUNNEL RELEASE Bilateral MAY/JUL 2004  . COLONOSCOPY  05/20/2007   [facility] colon    Prior to Admission medications   Medication Sig Start Date End Date Taking? Authorizing Provider  ALPRAZolam Duanne Moron) 0.5 MG tablet Take 0.5 mg 2 (two) times daily as needed by mouth (for anxiety).  02/04/13  Yes [provider]  aspirin EC 81 MG tablet Take 81 mg every other day by mouth. At bedtime.   Yes [provider]  docusate sodium (COLACE) 100 MG capsule Take 200 mg at bedtime by mouth.    Yes [provider]  ezetimibe (ZETIA) 10 MG tablet Take 10 mg at bedtime by mouth.    Yes [provider]  fluticasone (FLONASE) 50 MCG/ACT nasal spray Place 1-2 sprays daily as needed into both nostrils for allergies (for nasal congestion.).   Yes [provider]  lisinopril (PRINIVIL,ZESTRIL) 10 MG tablet Take 10 mg by mouth daily.  12/15/12  Yes [provider]  polyethylene glycol-electrolytes (TRILYTE) 420 g solution Take 4,000 mLs by mouth as directed. 08/06/17  Yes Annitta Needs, NP  Probiotic Product (PROBIOTIC PO) Take 1 tablet 4 (four) times a week by mouth.   Yes [provider]  Pumpkin Seed-Soy Germ (AZO BLADDER CONTROL/GO-LESS PO) Take 1 tablet 2 (two) times daily as needed by mouth (for urinary frequency/bladder control).   Yes [provider]  simvastatin (ZOCOR) 40 MG tablet Take 40 mg daily by mouth.  12/15/12  Yes [provider]  nitroGLYCERIN (NITROGLYN) 2 % ointment Place 0.5 inches 3 (three) times daily as needed onto the  skin for chest pain.     [provider]    Allergies as of 08/06/2017  . (No Known Allergies)    Family History  Problem Relation Age of Onset  . Colon cancer Maternal Grandmother   . Breast cancer Mother   . Kidney failure Father   . Breast cancer Sister   . Melanoma Sister   . Colon polyps Neg Hx     Social History   Socioeconomic History  . Marital status: Married    Spouse name: Not on file  . Number of children: Not on file  . Years of education: Not on file  . Highest education level: Not on file  Social Needs  . Financial resource strain: Not on file  . Food insecurity - worry: Not on file  . Food insecurity - inability: Not on file  . Transportation needs - medical: Not on file  . Transportation needs - non-medical: Not on file  Occupational History  . Not on file  Tobacco Use  . Smoking status: Former Research scientist (life sciences)  . Smokeless tobacco: Never Used  . Tobacco comment: quit 1990's  Substance and Sexual Activity  . Alcohol use: No  . Drug use: No  . Sexual activity: Not on file  Other Topics Concern  . Not on file  Social History Narrative  . Not on file    Review of Systems: See HPI, otherwise negative ROS   Physical Exam: BP 129/72  Pulse 90   Temp 97.9 F (36.6 C) (Oral)   Resp 16   Ht 5' 3.5" (1.613 m)   Wt 200 lb (90.7 kg)   SpO2 100%   BMI 34.87 kg/m  General:   Alert,  pleasant and cooperative in NAD Head:  Normocephalic and atraumatic. Neck:  Supple; Lungs:  Clear throughout to auscultation.    Heart:  Regular rate and rhythm. Abdomen:  Soft, nontender and nondistended. Normal bowel sounds, without guarding, and without rebound.   Neurologic:  Alert and  oriented x4;  grossly normal neurologically.  Impression/Plan:     SCREENING  Plan:  1. TCS TODAY DISCUSSED PROCEDURE, BENEFITS, & RISKS: < 1% chance of medication reaction, bleeding, perforation, or rupture of spleen/liver.

## 2017-09-13 NOTE — Op Note (Addendum)
[facility] Patient Name: [name] Procedure Date: 09/13/2017 10:28 AM MRN: [id number] Date of Birth: [birth year] Attending MD: Barney Drain MD, MD CSN: [id number] Age: 56 Admit Type: Outpatient Procedure:                Colonoscopy with ENDOSCOPIC MUCOSAL RESECTION/SQ                            INJECTION Indications:              Screening for colorectal malignant neoplasm-LAST                            TCS JUL 2008 Providers:                Barney Drain MD, MD, Janeece Riggers, RN, Randa Spike, Technician Referring MD:             Redmond School, MD Medicines:                Promethazine 25 mg IV, Meperidine 100 mg IV,                            Midazolam 5 mg IV Complications:            No immediate complications. Estimated Blood Loss:     Estimated blood loss: none. Procedure:                Pre-Anesthesia Assessment:                           - Prior to the procedure, a History and Physical                            was performed, and patient medications and                            allergies were reviewed. The patient's tolerance of                            previous anesthesia was also reviewed. The risks                            and benefits of the procedure and the sedation                            options and risks were discussed with the patient.                            All questions were answered, and informed consent                            was obtained. Prior Anticoagulants: The patient has                            taken  aspirin. ASA Grade Assessment: II - A patient                            with mild systemic disease. After reviewing the                            risks and benefits, the patient was deemed in                            satisfactory condition to undergo the procedure.                            After obtaining informed consent, the colonoscope                            was passed under direct  vision. Throughout the                            procedure, the patient's blood pressure, pulse, and                            oxygen saturations were monitored continuously. The                            [id number]) scope was introduced through                            the anus and advanced to the the cecum, identified                            by appendiceal orifice and ileocecal valve. The                            colonoscopy was somewhat difficult due to a                            tortuous colon. Successful completion of the                            procedure was aided by straightening and shortening                            the scope to obtain bowel loop reduction and                            COLOWRAP. The patient tolerated the procedure                            fairly well. The quality of the bowel preparation                            was good. The ileocecal valve, appendiceal orifice,  and rectum were photographed. Scope In: 10:58:56 AM Scope Out: 37:90:24 AM Scope Withdrawal Time: 0 hours 15 minutes 19 seconds  Total Procedure Duration: 0 hours 17 minutes 52 seconds  Findings:      A 8 mm polyp was found in the distal transverse colon. The polyp was       flat. Preparations were made for mucosal resection. ELEVIEW(2 CC) was       injected to raise the lesion. Snare mucosal resection was performed.       Resection and retrieval were complete. To prevent bleeding after the       polypectomy, two hemostatic clips were successfully placed (MR       conditional). There was no bleeding during the procedure.      The recto-sigmoid colon and sigmoid colon were moderately redundant. Impression:               - One 8 mm polyp in the distal transverse colon,                            removed with mucosal resection. Resected and                            retrieved.                           - Redundant LEFT colon. Moderate Sedation:       Moderate (conscious) sedation was administered by the endoscopy nurse       and supervised by the endoscopist. The following parameters were       monitored: oxygen saturation, heart rate, blood pressure, and response       to care. Total physician intraservice time was 36 minutes. Recommendation:           - High fiber diet. NEEDS ABDOMINAL PLAIN FILM PRIOR                            TO MRI TO CONFIRM CLIPS HAVE PASSED.                           - Continue present medications.                           - Await pathology results.                           - Repeat colonoscopy in 5-10 years for surveillance.                           - Patient has a contact number available for                            emergencies. The signs and symptoms of potential                            delayed complications were discussed with the                            patient. Return to normal  activities Victoria.                            Written discharge instructions were provided to the                            patient. Procedure Code(s):        --- Professional ---                           [phone], Colonoscopy, flexible; with endoscopic                            mucosal resection                           [phone], Moderate sedation services provided by the                            same physician or other qualified health care                            professional performing the diagnostic or                            therapeutic service that the sedation supports,                            requiring the presence of an independent trained                            observer to assist in the monitoring of the                            patient's level of consciousness and physiological                            status; initial 15 minutes of intraservice time,                            patient age 84 years or older                           [phone], Moderate sedation services; each additional                             15 minutes intraservice time Diagnosis Code(s):        --- Professional ---                           Z12.11, Encounter for screening for malignant                            neoplasm of colon  D12.3, Benign neoplasm of transverse colon (hepatic                            flexure or splenic flexure)                           Q43.8, Other specified congenital malformations of                            intestine CPT copyright 2016 [employer]. All rights reserved. The codes documented in this report are preliminary and upon coder review may  be revised to meet current compliance requirements. Barney Drain, MD Barney Drain MD, MD 09/13/2017 11:27:01 AM This report has been signed electronically. Number of Addenda: 0

## 2017-09-16 ENCOUNTER — Telehealth: Payer: Self-pay | Admitting: Gastroenterology

## 2017-09-16 NOTE — Telephone Encounter (Signed)
Please call pt. She had A SERRATED adenoma removed from her colon.  FOLLOW A HIGH FIBER DIET. NEXT TCS IN 5 YEARS. YOUR SISTERS, BROTHERS, CHILDREN, AND PARENTS NEED TO HAVE A COLONOSCOPY STARTING AT THE AGE OF 40.

## 2017-09-17 ENCOUNTER — Telehealth: Payer: Self-pay | Admitting: Gastroenterology

## 2017-09-17 NOTE — Telephone Encounter (Signed)
Pt was returning a call from DS. I told her DS was on the other line and she would have to call her back. Please call [phone] regarding results

## 2017-09-17 NOTE — Telephone Encounter (Signed)
See result note, pt is aware.  

## 2017-09-17 NOTE — Telephone Encounter (Signed)
PT is aware.

## 2017-09-17 NOTE — Telephone Encounter (Signed)
cc'ed to pcp, reminder in epic

## 2017-09-17 NOTE — Telephone Encounter (Signed)
LMOM to call.

## 2017-10-01 ENCOUNTER — Encounter (HOSPITAL_COMMUNITY): Payer: Self-pay | Admitting: Gastroenterology

## 2017-10-08 ENCOUNTER — Ambulatory Visit: Payer: Medicare PPO | Admitting: Urology

## 2017-12-03 ENCOUNTER — Ambulatory Visit: Payer: Medicare PPO | Admitting: Urology

## 2017-12-03 DIAGNOSIS — R3915 Urgency of urination: Secondary | ICD-10-CM | POA: Diagnosis not present

## 2017-12-03 DIAGNOSIS — N3943 Post-void dribbling: Secondary | ICD-10-CM

## 2017-12-03 DIAGNOSIS — R350 Frequency of micturition: Secondary | ICD-10-CM | POA: Diagnosis not present

## 2017-12-23 IMAGING — MG 2D DIGITAL SCREENING BILATERAL MAMMOGRAM WITH CAD AND ADJUNCT TO
6 of 10 series · 6 of 26 positions shown · non-contrast
Comparison: Previous exam(s).

CLINICAL DATA: Screening.

EXAM:
2D DIGITAL SCREENING BILATERAL MAMMOGRAM WITH CAD AND ADJUNCT TOMO

[L MLO (1 of 2)]
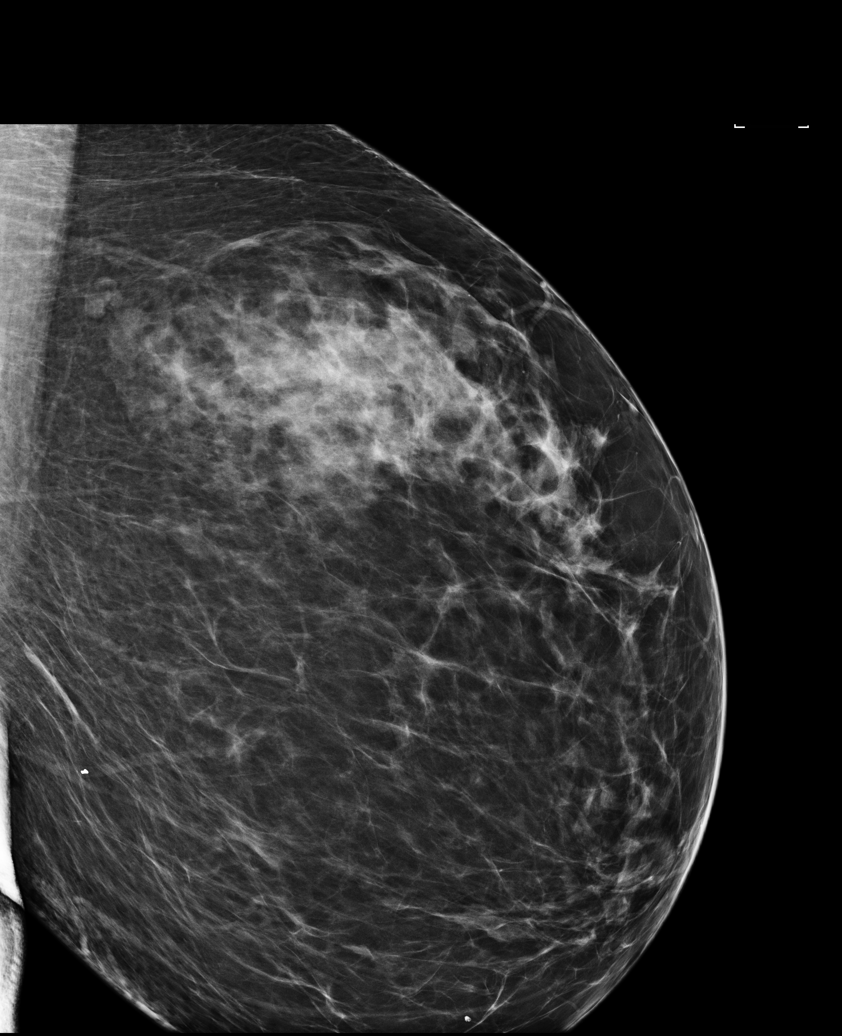

[R CC (1 of 2)]
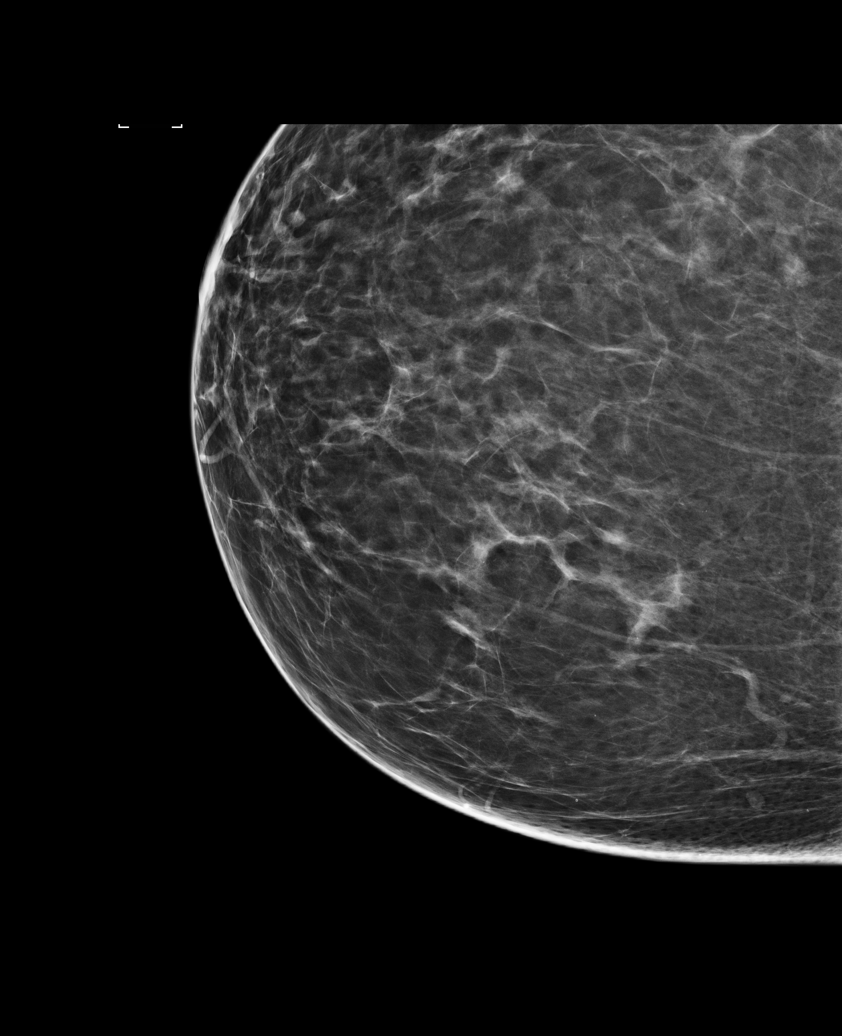

[R CC (2 of 2)]
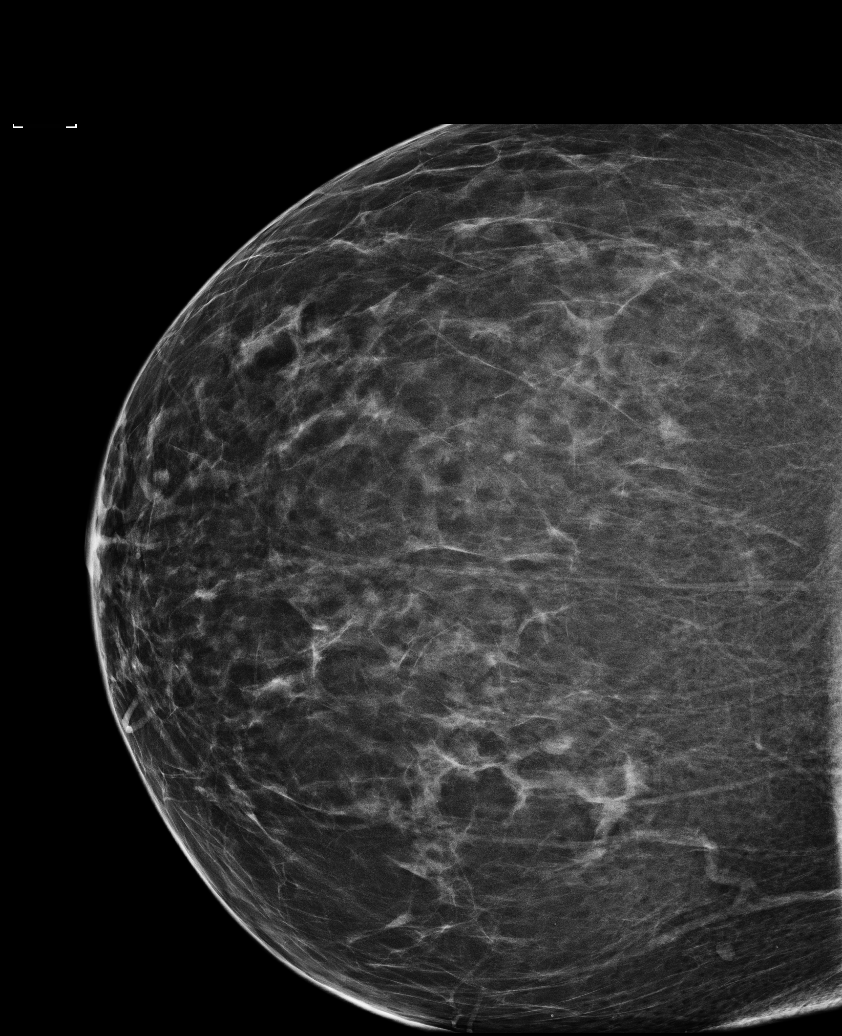

[R MLO]
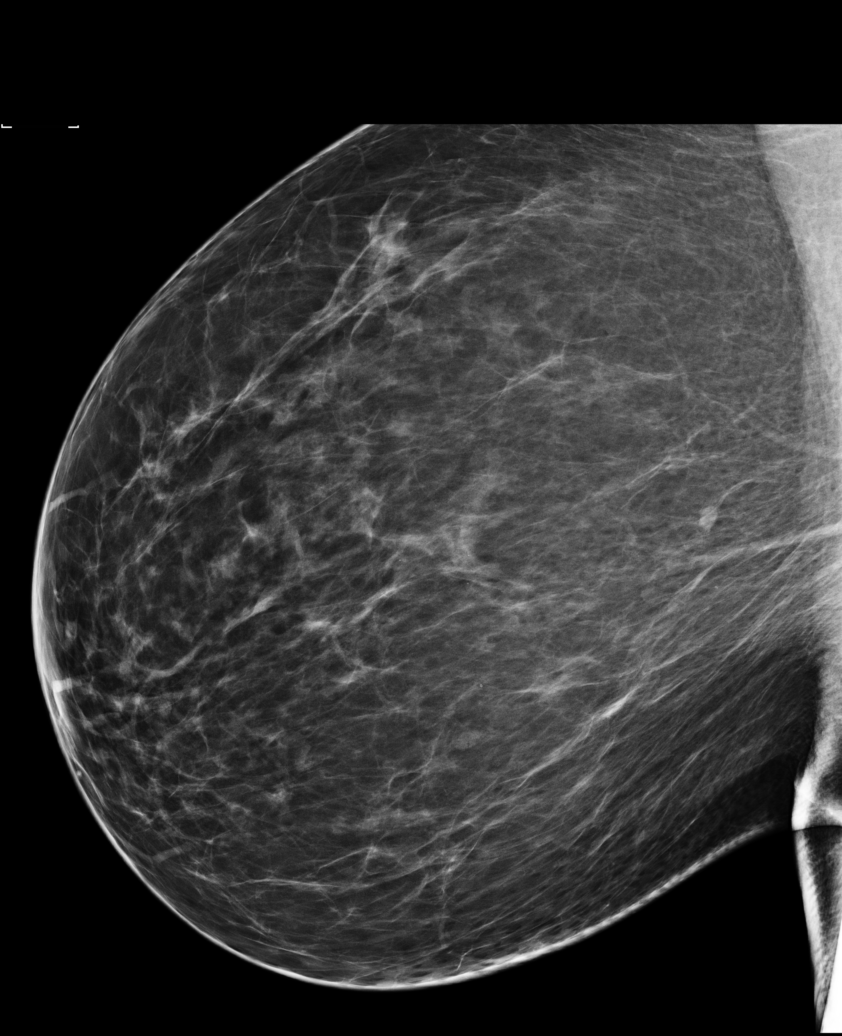

[L CC]
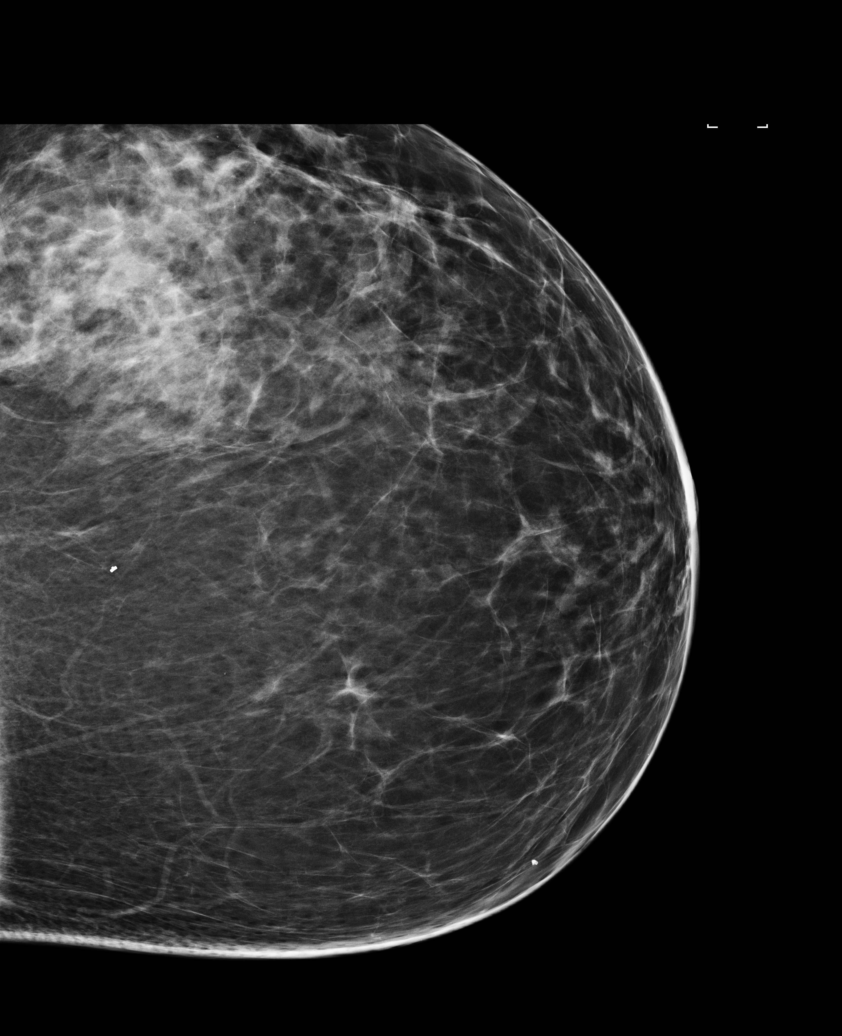

[L MLO (2 of 2)]
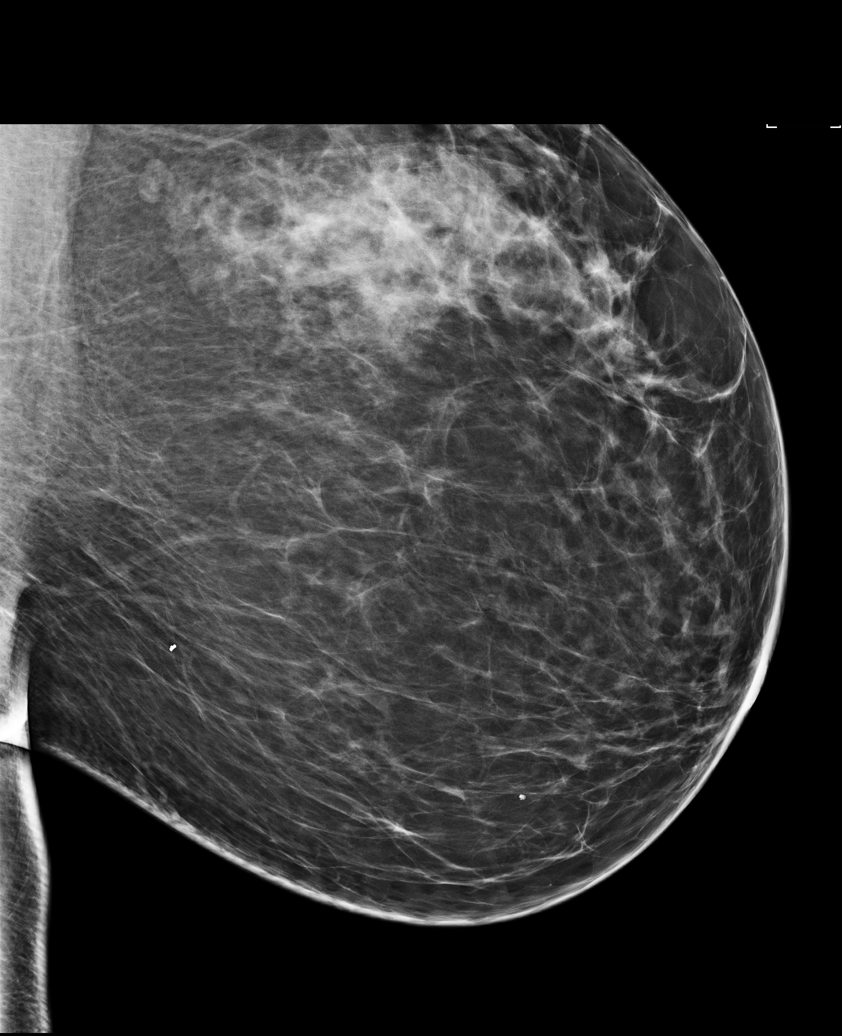

[6 of 26 positions shown; findings below may reference images not displayed]

ACR Breast Density Category b: There are scattered areas of
fibroglandular density.
FINDINGS: There are no findings suspicious for malignancy. Images were
processed with CAD.
IMPRESSION: No mammographic evidence of malignancy. A result letter of this
screening mammogram will be mailed directly to the patient.

RECOMMENDATION:
Screening mammogram in one year. (Code:97-6-RS4)

BI-RADS CATEGORY  1: Negative.

## 2018-01-06 DIAGNOSIS — F419 Anxiety disorder, unspecified: Secondary | ICD-10-CM | POA: Diagnosis not present

## 2018-01-06 DIAGNOSIS — J302 Other seasonal allergic rhinitis: Secondary | ICD-10-CM | POA: Diagnosis not present

## 2018-01-06 DIAGNOSIS — E6609 Other obesity due to excess calories: Secondary | ICD-10-CM | POA: Diagnosis not present

## 2018-01-06 DIAGNOSIS — Z6836 Body mass index (BMI) 36.0-36.9, adult: Secondary | ICD-10-CM | POA: Diagnosis not present

## 2018-03-18 DIAGNOSIS — H40053 Ocular hypertension, bilateral: Secondary | ICD-10-CM | POA: Diagnosis not present

## 2018-07-07 ENCOUNTER — Other Ambulatory Visit (HOSPITAL_COMMUNITY): Payer: Self-pay | Admitting: Internal Medicine

## 2018-07-07 DIAGNOSIS — Z1231 Encounter for screening mammogram for malignant neoplasm of breast: Secondary | ICD-10-CM

## 2018-07-15 DIAGNOSIS — J302 Other seasonal allergic rhinitis: Secondary | ICD-10-CM | POA: Diagnosis not present

## 2018-07-15 DIAGNOSIS — J069 Acute upper respiratory infection, unspecified: Secondary | ICD-10-CM | POA: Diagnosis not present

## 2018-07-15 DIAGNOSIS — Z1389 Encounter for screening for other disorder: Secondary | ICD-10-CM | POA: Diagnosis not present

## 2018-07-15 DIAGNOSIS — Z6837 Body mass index (BMI) 37.0-37.9, adult: Secondary | ICD-10-CM | POA: Diagnosis not present

## 2018-07-24 DIAGNOSIS — R197 Diarrhea, unspecified: Secondary | ICD-10-CM | POA: Diagnosis not present

## 2018-07-24 DIAGNOSIS — Z23 Encounter for immunization: Secondary | ICD-10-CM | POA: Diagnosis not present

## 2018-07-24 DIAGNOSIS — Z6836 Body mass index (BMI) 36.0-36.9, adult: Secondary | ICD-10-CM | POA: Diagnosis not present

## 2018-07-24 DIAGNOSIS — E6609 Other obesity due to excess calories: Secondary | ICD-10-CM | POA: Diagnosis not present

## 2018-07-28 ENCOUNTER — Ambulatory Visit (HOSPITAL_COMMUNITY)
Admission: RE | Admit: 2018-07-28 | Discharge: 2018-07-28 | Disposition: A | Discharge status: Home / Self Care | Payer: Medicare PPO | Source: Ambulatory Visit | Attending: Internal Medicine | Admitting: Internal Medicine

## 2018-07-28 ENCOUNTER — Encounter (HOSPITAL_COMMUNITY): Payer: Self-pay

## 2018-07-28 DIAGNOSIS — Z1231 Encounter for screening mammogram for malignant neoplasm of breast: Secondary | ICD-10-CM | POA: Insufficient documentation

## 2018-08-20 DIAGNOSIS — Z1389 Encounter for screening for other disorder: Secondary | ICD-10-CM | POA: Diagnosis not present

## 2018-08-20 DIAGNOSIS — R7309 Other abnormal glucose: Secondary | ICD-10-CM | POA: Diagnosis not present

## 2018-08-20 DIAGNOSIS — J302 Other seasonal allergic rhinitis: Secondary | ICD-10-CM | POA: Diagnosis not present

## 2018-08-20 DIAGNOSIS — Z6841 Body Mass Index (BMI) 40.0 and over, adult: Secondary | ICD-10-CM | POA: Diagnosis not present

## 2018-08-20 DIAGNOSIS — E782 Mixed hyperlipidemia: Secondary | ICD-10-CM | POA: Diagnosis not present

## 2018-08-20 DIAGNOSIS — Z0001 Encounter for general adult medical examination with abnormal findings: Secondary | ICD-10-CM | POA: Diagnosis not present

## 2018-11-27 ENCOUNTER — Ambulatory Visit: Payer: Medicare PPO | Admitting: Gastroenterology

## 2018-11-27 ENCOUNTER — Encounter: Payer: Self-pay | Admitting: Gastroenterology

## 2018-11-27 VITALS — BP 140/83 | HR 97 | Temp 96.9°F | Ht 63.5 in | Wt 203.8 lb

## 2018-11-27 DIAGNOSIS — R1013 Epigastric pain: Secondary | ICD-10-CM | POA: Insufficient documentation

## 2018-11-27 DIAGNOSIS — Z1159 Encounter for screening for other viral diseases: Secondary | ICD-10-CM | POA: Diagnosis not present

## 2018-11-27 MED ORDER — METRONIDAZOLE 500 MG PO TABS: 500.0000 mg | ORAL_TABLET | Freq: Two times a day (BID) | ORAL | 0 refills | Status: DC

## 2018-11-27 MED ORDER — CIPROFLOXACIN HCL 500 MG PO TABS: ORAL_TABLET | ORAL | 0 refills | Status: DC

## 2018-11-27 NOTE — Assessment & Plan Note (Signed)
SYMPTOMS NOT IDEALLY CONTROLLED and persist since AUGMENTIN 875 MG BID AND MAY BE DUE TO SMALL INTESTINE BACTERIAL OVERGROWTH OR LACTOSE INTOLERANCE.  DRINK WATER TO KEEP YOUR URINE LIGHT YELLOW.   TO REDUCE BLOATING/GAS/LOOSE STOOL:   1. TAKE CIPRO AND FLAGYL TWICE DAILY FOR 5 DAYS. Marland Kitchen MEDICATION SIDE EFFECTS INCLUDE HEEL PAIN, NAUSEA, VOMITING. PATIENT SHOULD AVOID ALCOHOL AND COUGH SYRUP WITH ALCOHOL.    2. AVOID DAIRY FOR ONE MONTH.  HANDOUT GIVEN.  CONTINUE CULTURELLE. HOLD OFF ON FIBER PILLS FOR ONE MONTH. FOLLOW UP IN 4 MOS.

## 2018-11-27 NOTE — Progress Notes (Signed)
Subjective:    Patient ID: [name], female    DOB: [birth year], 58 y.o.   MRN: [id number]  Redmond School, MD   HPI HAD sinus infection and took AUGMENTIN 875 MG BID FOR 5 DAYS. LAST DOSE SEP 2019. MILD CRAMP ABDOMINAL PAIN/DIARRHEA LASTED ABOUT 8 DAYS. HAD A FEW HEMORRHOIDS FLARE. NO LOMOTIL SINCE SEP 2019. MADE HER DEVELOP ANXIETY ABOUT HER BOWELS. FISSURE DOING BETTER.   NOW TAKING CULTURELLE. BOWEL WERE GOOD UNTIL ABX. SOME DAYS GOOD AND SOME DAYS NOT QUITE RIGHT. BMs: ONCE DAILY, #5, CAN HAVE #4 BEFORE ABX. MILK: NOT A LOT, CHEESE: 3X/WEEK, ICE CREAM: NOT IN A WHILE(LAST TIME SAT). BOTHERED HER STOMACH. A LITTLE BELCHING/FLATULENCE.     PT DENIES FEVER, CHILLS, HEMATOCHEZIA, HEMATEMESIS, nausea, vomiting, melena, CHEST PAIN, SHORTNESS OF BREATH, CHANGE IN BOWEL IN HABITS, constipation, problems swallowing, OR heartburn or indigestion.  Past Medical History:  Diagnosis Date  . Anal fissure   . Carpal tunnel syndrome on both sides   . Hyperlipidemia   . Hypertension     Past Surgical History:  Procedure Laterality Date  . CARPAL TUNNEL RELEASE Bilateral MAY/JUL 2004  . COLONOSCOPY  05/20/2007   [facility] colon  . COLONOSCOPY N/A 09/13/2017   Procedure: COLONOSCOPY;  Surgeon: Danie Binder, MD;  Location: [facility];  Service: Endoscopy;  Laterality: N/A;  10:30 AM   No Known Allergies  Current Outpatient Medications  Medication Sig    . ALPRAZolam (XANAX) 0.5 MG tablet Take 0.5 mg 2 (two) times daily as needed by mouth (for anxiety).     Marland Kitchen aspirin EC 81 MG tablet Take 81 mg every other day by mouth. At bedtime.    Marland Kitchen ezetimibe (ZETIA) 10 MG tablet Take 10 mg at bedtime by mouth.     . fluticasone (FLONASE) 50 MCG/ACT nasal spray Place 1-2 sprays daily as needed into both nostrils for allergies (for nasal congestion.).    Marland Kitchen lisinopril (PRINIVIL,ZESTRIL) 10 MG tablet Take 10 mg by mouth daily.     . nitroGLYCERIN (NITROGLYN) 2 % ointment Place 0.5 inches 3  (three) times daily as needed onto the skin for chest pain.     . Probiotic Product (PROBIOTIC PO) Take 1 tablet by mouth daily.     . Simethicone (GAS-X PO) Take by mouth 4 (four) times daily. Taking after meals and at bedtime    . simvastatin (ZOCOR) 40 MG tablet Take 40 mg daily by mouth.     . docusate sodium (COLACE) 100 MG capsule Take 200 mg at bedtime by mouth.  ON HOLD   . Pumpkin Seed-Soy Germ (AZO BLADDER CONTROL/GO-LESS PO) Take 1 tablet 2 (two) times daily as needed by mouth (for urinary frequency/bladder control).     Review of Systems PER HPI OTHERWISE ALL SYSTEMS ARE NEGATIVE.    Objective:   Physical Exam Vitals signs reviewed.  Constitutional:      General: She is not in acute distress.    Appearance: She is well-developed.  HENT:     Head: Normocephalic and atraumatic.     Mouth/Throat:     Pharynx: No oropharyngeal exudate.  Eyes:     General: No scleral icterus.    Pupils: Pupils are equal, round, and reactive to light.  Neck:     Musculoskeletal: Normal range of motion and neck supple.  Cardiovascular:     Rate and Rhythm: Normal rate and regular rhythm.     Heart sounds: Normal heart sounds.  Pulmonary:  Effort: Pulmonary effort is normal. No respiratory distress.     Breath sounds: Normal breath sounds.  Abdominal:     General: Bowel sounds are normal. There is no distension.     Palpations: Abdomen is soft.     Tenderness: There is no abdominal tenderness.  Lymphadenopathy:     Cervical: No cervical adenopathy.  Neurological:     General: No focal deficit present.     Mental Status: She is alert and oriented to person, place, and time. Mental status is at baseline.  Psychiatric:        Behavior: Behavior normal.     Comments: ANXIOUS MOOD, flat affect       Assessment & Plan:

## 2018-11-27 NOTE — Progress Notes (Signed)
ON RECALL  °

## 2018-11-27 NOTE — Patient Instructions (Addendum)
DRINK WATER TO KEEP YOUR URINE LIGHT YELLOW.   TO REDUCE BLOATING/GAS/LOOSE STOOL:   1. TAKE CIPRO AND FLAGYL TWICE DAILY FOR 5 DAYS. Marland Kitchen MEDICATION SIDE EFFECTS INCLUDE HEEL PAIN, NAUSEA, VOMITING. PATIENT SHOULD AVOID ALCOHOL AND COUGH SYRUP WITH ALCOHOL.    2. AVOID DAIRY FOR ONE MONTH. SEE INFO BELOW.   CONTINUE CULTURELLE.  HOLD OFF ON FIBER PILLS FOR ONE MONTH.  Complete blood draw.  FOLLOW UP IN 4 MOS.    Lactose Free Diet Lactose is a carbohydrate that is found mainly in milk and milk products, as well as in foods with added milk or whey. Lactose must be digested by the enzyme in order to be used by the body. Lactose intolerance occurs when there is a shortage of lactase. When your body is not able to digest lactose, you may feel sick to your stomach (nausea), bloating, cramping, gas and diarrhea.  There are many dairy products that may be tolerated better than milk by some people:  The use of cultured dairy products such as yogurt, buttermilk, cottage cheese, and sweet acidophilus milk (Kefir) for lactase-deficient individuals is usually well tolerated. This is because the healthy bacteria help digest lactose.   Lactose-hydrolyzed milk (Lactaid) contains 40-90% less lactose than milk and may also be well tolerated.    SPECIAL NOTES  Lactose is a carbohydrates. The major food source is dairy products. Reading food labels is important. Many products contain lactose even when they are not made from milk. Look for the following words: whey, milk solids, dry milk solids, nonfat dry milk powder. Typical sources of lactose other than dairy products include breads, candies, cold cuts, prepared and processed foods, and commercial sauces and gravies.   All foods must be prepared without milk, cream, or other dairy foods.   Soy milk and lactose-free supplements (LACTASE) may be used as an alternative to milk.   FOOD GROUP ALLOWED/RECOMMENDED AVOID/USE SPARINGLY  BREADS / STARCHES 4  servings or more* Breads and rolls made without milk. [place], [place], or [place] bread. Breads and rolls that contain milk. Prepared mixes such as muffins, biscuits, waffles, pancakes. Sweet rolls, donuts, [place] toast (if made with milk or lactose).  Crackers: Soda crackers, graham crackers. Any crackers prepared without lactose. Zwieback crackers, corn curls, or any that contain lactose.  Cereals: Cooked or dry cereals prepared without lactose (read labels). Cooked or dry cereals prepared with lactose (read labels). Total, Cocoa Krispies. Special K.  Potatoes / Pasta / Rice: Any prepared without milk or lactose. Popcorn. Instant potatoes, frozen [place] fries, scalloped or au gratin potatoes.  VEGETABLES 2 servings or more Fresh, frozen, and canned vegetables. Creamed or breaded vegetables. Vegetables in a cheese sauce or with lactose-containing margarines.  FRUIT 2 servings or more All fresh, canned, or frozen fruits that are not processed with lactose. Any canned or frozen fruits processed with lactose.  MEAT & SUBSTITUTES 2 servings or more (4 to 6 oz. total per day) Plain beef, chicken, fish, [place], lamb, veal, pork, or ham. Kosher prepared meat products. Strained or junior meats that do not contain milk. Eggs, soy meat substitutes, nuts. Scrambled eggs, omelets, and souffles that contain milk. Creamed or breaded meat, fish, or fowl. Sausage products such as wieners, liver sausage, or cold cuts that contain milk solids. Cheese, cottage cheese, or cheese spreads.  MILK None. (See "BEVERAGES" for milk substitutes. See "DESSERTS" for ice cream and frozen desserts.) Milk (whole, 2%, skim, or chocolate). Evaporated, powdered, or condensed milk; malted milk.  SOUPS & COMBINATION FOODS Bouillon, broth, vegetable soups, clear soups, consomms. Homemade soups made with allowed ingredients. Combination or prepared foods that do not contain milk or milk products (read labels). Cream soups, chowders,  commercially prepared soups containing lactose. Macaroni and cheese, pizza. Combination or prepared foods that contain milk or milk products.  DESSERTS & SWEETS In moderation Water and fruit ices; gelatin; angel food cake. Homemade cookies, pies, or cakes made from allowed ingredients. Pudding (if made with water or a milk substitute). Lactose-free tofu desserts. Sugar, honey, corn syrup, jam, jelly; marmalade; molasses (beet sugar); Pure sugar candy; marshmallows. Ice cream, ice milk, sherbet, custard, pudding, frozen yogurt. Commercial cake and cookie mixes. Desserts that contain chocolate. Pie crust made with milk-containing margarine; reduced-calorie desserts made with a sugar substitute that contains lactose. Toffee, peppermint, butterscotch, chocolate, caramels.  FATS & OILS In moderation Butter (as tolerated; contains very small amounts of lactose). Margarines and dressings that do not contain milk, Vegetable oils, shortening, Miracle Whip, mayonnaise, nondairy cream & whipped toppings without lactose or milk solids added (examples: Coffee Rich, Carnation Coffeemate, Rich's Whipped Topping, PolyRich). Berniece Salines. Margarines and salad dressings containing milk; cream, cream cheese; peanut butter with added milk solids, sour cream, chip dips, made with sour cream.  BEVERAGES Carbonated drinks; tea; coffee and freeze-dried coffee; some instant coffees (check labels). Fruit drinks; fruit and vegetable juice; Rice or Soy milk. Ovaltine, hot chocolate. Some cocoas; some instant coffees; instant iced teas; powdered fruit drinks (read labels).   CONDIMENTS / MISCELLANEOUS Soy sauce, carob powder, olives, gravy made with water, baker's cocoa, pickles, pure seasonings and spices, wine, pure monosodium glutamate, catsup, mustard. Some chewing gums, chocolate, some cocoas. Certain antibiotics and vitamin / mineral preparations. Spice blends if they contain milk products. MSG extender. Artificial sweeteners that  contain lactose such as Equal (Nutra-Sweet) and Sweet 'n Low. Some nondairy creamers (read labels).   SAMPLE MENU*  Breakfast   Orange Juice.  Banana.   Bran flakes.   Nondairy Creamer.  [place] Bread (toasted).   Butter or milk-free margarine.   Coffee or tea.    Noon Meal   Chicken Breast.  Rice.   Green beans.   Butter or milk-free margarine.  Fresh melon.   Coffee or tea.    Evening Meal   Roast Beef.  Baked potato.   Butter or milk-free margarine.   Broccoli.   Lettuce salad with vinegar and oil dressing.  [employer]   Coffee or tea.

## 2018-11-27 NOTE — Progress Notes (Signed)
CC'ED TO PCP 

## 2018-11-27 NOTE — Assessment & Plan Note (Signed)
COMPLETE BLOOD DRAW FOR HEP C.

## 2018-11-28 ENCOUNTER — Telehealth: Payer: Self-pay | Admitting: Gastroenterology

## 2018-11-28 LAB — HEPATITIS C ANTIBODY
Hepatitis C Ab: NONREACTIVE
SIGNAL TO CUT-OFF: 0.08 (ref <1.00)

## 2018-11-28 NOTE — Telephone Encounter (Signed)
Pt is aware.  

## 2018-11-28 NOTE — Telephone Encounter (Signed)
PT is aware OK to take Gas x with the Cipro and Flagyl if needed. She said she has not had to yet, just wanted to make sure ok.

## 2018-11-28 NOTE — Telephone Encounter (Signed)
PLEASE CALL PT. HER HEPATITIS C Ab IS NEGATIVE.

## 2018-11-28 NOTE — Telephone Encounter (Signed)
PLEASE CALL PT. IT IS OK TO USE GAS-X WITH ABX BUT SHE DOESN'T HAVE TO.

## 2018-11-28 NOTE — Telephone Encounter (Signed)
[name] PATIENT, SHE WANTS TO KNOW IF SHE SHOULD STILL TAKE GAS X IN CONJUNCTION WITH THE MEDS SHE WAS PRESCRIBED FROM HERE

## 2018-12-01 NOTE — Telephone Encounter (Signed)
PT is aware.

## 2018-12-11 DIAGNOSIS — J069 Acute upper respiratory infection, unspecified: Secondary | ICD-10-CM | POA: Diagnosis not present

## 2018-12-11 DIAGNOSIS — Z6839 Body mass index (BMI) 39.0-39.9, adult: Secondary | ICD-10-CM | POA: Diagnosis not present

## 2018-12-11 DIAGNOSIS — Z1389 Encounter for screening for other disorder: Secondary | ICD-10-CM | POA: Diagnosis not present

## 2018-12-15 ENCOUNTER — Telehealth: Payer: Self-pay

## 2018-12-15 NOTE — Telephone Encounter (Signed)
Pt called to say she had a sinus/bronchitis infection last week and was prescribed Moxifloxacin antibiotic and had a shot.  She said the antibiotic made her nauseated, gassy and gave her some diarrhea. She stopped after the second dose.  She feels some better, but it left her stomach our of sorts and she had just gotten straightened out from the Flagyl and Cipro that Dr. Oneida Alar gave her. Her stools are a little loose now and stomach is rumbly, otherwise she is feeling better.  Pt said she called Dr. Delanna Ahmadi office and let him know about the antibiotic and he said for her to continue it. She said it was so bad just after 2 doses that she was afraid to take it.She is concerned if she doesn't take it the bronchitis will go into pneumonia.  She said she knew that Dr. Oneida Alar knows how her stomach is and she would like her input as to what she should do.  She is aware that Dr. Oneida Alar is out of the office and will be back at the hospital tomorrow.

## 2018-12-17 NOTE — Telephone Encounter (Signed)
PT is aware.

## 2018-12-17 NOTE — Telephone Encounter (Signed)
PLEASE CALL PT. IF SHE CAN'T TOLERATE MOXIFLOXACIN SHE SHOULD NOT CONTINUE IT. BRONCHITIS USUALLY GETS BETTER IN 7-10 DAYS WITH OR WITHOUT ABX. IT DOES NOT LEAD TO PNA. SHE SHOULD TAKE A PROBIOTIC PILL DAILY AND AVOID DIARY UNTIL HER STOMACH SETTLES DOWN.

## 2019-02-24 DIAGNOSIS — Z0001 Encounter for general adult medical examination with abnormal findings: Secondary | ICD-10-CM | POA: Diagnosis not present

## 2019-02-24 DIAGNOSIS — G894 Chronic pain syndrome: Secondary | ICD-10-CM | POA: Diagnosis not present

## 2019-02-24 DIAGNOSIS — F419 Anxiety disorder, unspecified: Secondary | ICD-10-CM | POA: Diagnosis not present

## 2019-02-24 DIAGNOSIS — E7849 Other hyperlipidemia: Secondary | ICD-10-CM | POA: Diagnosis not present

## 2019-02-24 DIAGNOSIS — Z6839 Body mass index (BMI) 39.0-39.9, adult: Secondary | ICD-10-CM | POA: Diagnosis not present

## 2019-02-24 DIAGNOSIS — Z1389 Encounter for screening for other disorder: Secondary | ICD-10-CM | POA: Diagnosis not present

## 2019-02-24 DIAGNOSIS — I10 Essential (primary) hypertension: Secondary | ICD-10-CM | POA: Diagnosis not present

## 2019-03-03 ENCOUNTER — Encounter: Payer: Self-pay | Admitting: Gastroenterology

## 2019-04-02 ENCOUNTER — Encounter: Payer: Self-pay | Admitting: Gastroenterology

## 2019-04-02 ENCOUNTER — Ambulatory Visit (INDEPENDENT_AMBULATORY_CARE_PROVIDER_SITE_OTHER): Payer: Medicare PPO | Admitting: Gastroenterology

## 2019-04-02 ENCOUNTER — Other Ambulatory Visit: Payer: Self-pay

## 2019-04-02 DIAGNOSIS — R1013 Epigastric pain: Secondary | ICD-10-CM | POA: Diagnosis not present

## 2019-04-02 NOTE — Progress Notes (Signed)
ON RECALL  °

## 2019-04-02 NOTE — Progress Notes (Signed)
Subjective:     Patient ID: NYJAI GRAFF, female   DOB: Apr 11, 1961, 58 y.o.   MRN: [id number]   Primary Care Physician:  Redmond School, MD  Primary GI:  Barney Drain, MD   Patient Location: home   Provider Location: [facility] office   Reason for Visit: DYSPEPSIA   Persons present on the virtual encounter, with roles: patient, myself (provider), MARTINA BOOTH CMA (update meds/allergies)   Total time (minutes) spent on medical discussion:   15 MINUTES   Due to COVID-19, visit was VIA TELEPHONE VISIT DUE TO COVID 19. VISIT IS CONDUCTED VIRTUALLY AND WAS REQUESTED BY PATIENT.   Virtual Visit via TELEPHONE   I connected with [place]  and verified that I am speaking with the correct person using two identifiers.   I discussed the limitations, risks, security and privacy concerns of performing an evaluation and management service by telephone/video and the availability of in person appointments. I also discussed with the patient that there may be a patient responsible charge related to this service. The patient expressed understanding and agreed to proceed.   HPI FEELS BETTER AFTER CIP/FLAGYL. NO SIDE EFFECTS. NO DIARRHEA. RARE LOOSE STOOLS. BLOATING: NOT REALLY. AVOIDED DAIRY FOR AT LEAST ONE MONTH. TAKING PROBIOTICS. STOMACH FEELS "NERVOUS" BUT NOT OFTEN: < 1-2 XMO. BMs: IN AM, NL #4. FISSURE OK. NOT NEEDING STOOL SOFTENER.  PT DENIES FEVER, CHILLS, HEMATOCHEZIA, HEMATEMESIS, nausea, vomiting, melena, CHEST PAIN, SHORTNESS OF BREATH,  CHANGE IN BOWEL IN HABITS, constipation, abdominal pain, problems swallowing, OR heartburn or indigestion.  Past Medical History:  Diagnosis Date  . Anal fissure   . Carpal tunnel syndrome on both sides   . Hyperlipidemia   . Hypertension    Past Surgical History:  Procedure Laterality Date  . CARPAL TUNNEL RELEASE Bilateral MAY/JUL 2004  . COLONOSCOPY  05/20/2007   [facility] colon  . COLONOSCOPY N/A 09/13/2017   Procedure: COLONOSCOPY;   Surgeon: Danie Binder, MD;  Location: [facility];  Service: Endoscopy;  Laterality: N/A;  10:30 AM   No Known Allergies  Current Outpatient Medications  Medication Sig    . ALPRAZolam (XANAX) 0.5 MG tablet Take 0.5 mg 2 (two) times daily as needed by mouth (for anxiety).     Marland Kitchen aspirin EC 81 MG tablet Take 81 mg QOD At bedtime.    Marland Kitchen ezetimibe (ZETIA) 10 MG tablet Take 10 mg at bedtime by mouth.     Asencion Islam 50 MCG/ACT nasal spray Place 1-2 sprays daily as needed into both nostrils for allergies (for nasal congestion.).    Marland Kitchen lisinopril (PRINIVIL,ZESTRIL) 10 MG tablet Take 10 mg by mouth daily.     . Probiotic Product (PROBIOTIC PO) Take 1 tablet by mouth daily.     . Pumpkin Seed-Soy Germ (AZO BLADDER CONTROL/GO-LESS PO) Take 1 tablet 2 (two) times daily as needed by mouth (for urinary frequency/bladder control).    . Simethicone (GAS-X PO) Take by mouth as needed.     . simvastatin (ZOCOR) 40 MG tablet Take 40 mg daily by mouth.     .      .      .      .       Review of Systems PER HPI OTHERWISE ALL SYSTEMS ARE NEGATIVE.    Objective:   Physical Exam TELEPHONE VISIT DUE TO COVID 19, VISIT IS CONDUCTED VIRTUALLY AND WAS REQUESTED BY PATIENT.    Assessment:         Plan:

## 2019-04-02 NOTE — Assessment & Plan Note (Signed)
DUE TO ABX AND DAIRY INTAKE. CLINICALLY IMPROVED. SYMPTOMS FAIRLY WELL CONTROLLED WITH LIMITING DAIRY.  MOXIFLOXACIN NOTED AS AN ALLERGY. TOLERATED CIPRO. DRINK WATER TO KEEP YOUR URINE LIGHT YELLOW. CONTINUE YOUR WEIGHT LOSS EFFORTS. IF YOU CONSUME DAIRY, ADD LACTASE 3 PILLS WITH MEALS UP TO THREE TIMES A DAY.  WOULD CONSIDER REPEAT COURSE OF CIP/FLAGYL IF GI UPSET WORSENS. Please CALL or SEND me A MY CHART MESSAGE IF YOU HAVE QUESTIONS OR CONCERNS. FOLLOW UP IN 6 MOS.

## 2019-04-02 NOTE — Patient Instructions (Signed)
DRINK WATER TO KEEP YOUR URINE LIGHT YELLOW.  CONTINUE YOUR WEIGHT LOSS EFFORTS.  IF YOU CONSUME DAIRY, ADD LACTASE 3 PILLS WITH MEALS UP TO THREE TIMES A DAY.   Please CALL or SEND me A MY CHART MESSAGE IF YOU HAVE QUESTIONS OR CONCERNS.   FOLLOW UP IN 6 MOS.

## 2019-04-02 NOTE — Progress Notes (Signed)
CC'D TO PCP °

## 2019-04-07 ENCOUNTER — Telehealth: Payer: Self-pay

## 2019-04-07 MED ORDER — DICYCLOMINE HCL 10 MG PO CAPS: 10.0000 mg | ORAL_CAPSULE | Freq: Three times a day (TID) | ORAL | 3 refills | Status: DC

## 2019-04-07 NOTE — Telephone Encounter (Signed)
Pt was informed.

## 2019-04-07 NOTE — Addendum Note (Signed)
Addended by: Annitta Needs on: 04/07/2019 01:22 PM   Modules accepted: Orders

## 2019-04-07 NOTE — Telephone Encounter (Signed)
Pt called and said she has had watery diarrhea for the last 2 days and has about 3 episodes in the AM.  She does not have abdominal pain, although her stomach has a not feeling/sensation. She has had some nausea also. She had some old medication that Dr. Oneida Alar gave her in 2014, Dicyclomine.  She said that medication helped her but it is so old now.  She is aware that Dr. Oneida Alar is off and I will send a note to Roseanne Kaufman, NP who is covering today. Vicente Males, please advise!

## 2019-04-07 NOTE — Telephone Encounter (Signed)
I am sending in dicyclomine. If worsens, call us. Monitor for dry mouth, dizziness, constipation, confusion.

## 2019-07-27 ENCOUNTER — Other Ambulatory Visit (HOSPITAL_COMMUNITY): Payer: Self-pay | Admitting: Internal Medicine

## 2019-07-27 DIAGNOSIS — Z1231 Encounter for screening mammogram for malignant neoplasm of breast: Secondary | ICD-10-CM

## 2019-08-05 DIAGNOSIS — F419 Anxiety disorder, unspecified: Secondary | ICD-10-CM | POA: Diagnosis not present

## 2019-08-10 ENCOUNTER — Other Ambulatory Visit: Payer: Self-pay

## 2019-08-10 ENCOUNTER — Ambulatory Visit (HOSPITAL_COMMUNITY)
Admission: RE | Admit: 2019-08-10 | Discharge: 2019-08-10 | Disposition: A | Discharge status: Home / Self Care | Payer: Medicare PPO | Source: Ambulatory Visit | Attending: Internal Medicine | Admitting: Internal Medicine

## 2019-08-10 DIAGNOSIS — Z1231 Encounter for screening mammogram for malignant neoplasm of breast: Secondary | ICD-10-CM | POA: Diagnosis not present

## 2019-08-14 DIAGNOSIS — H109 Unspecified conjunctivitis: Secondary | ICD-10-CM | POA: Diagnosis not present

## 2019-08-14 DIAGNOSIS — Z6841 Body Mass Index (BMI) 40.0 and over, adult: Secondary | ICD-10-CM | POA: Diagnosis not present

## 2019-09-07 ENCOUNTER — Encounter: Payer: Self-pay | Admitting: Gastroenterology

## 2019-09-10 DIAGNOSIS — H1045 Other chronic allergic conjunctivitis: Secondary | ICD-10-CM | POA: Diagnosis not present

## 2019-11-09 ENCOUNTER — Telehealth: Payer: Self-pay

## 2019-11-09 NOTE — Telephone Encounter (Signed)
Pt said Dr. Oneida Alar told her if she was doing fine she would not need an appointment. She said she has been doing really well, and she just wants Dr. Oneida Alar to know that she appreciates everything so much and will miss her so much. She does not want to schedule an appointment that is not needed and take up time that someone else could use. BUT THANKS TO DR FIELDS AND BEST WISHES!

## 2019-11-09 NOTE — Telephone Encounter (Signed)
NEXT COLONOSCOPY IN NOV 2023. OK TO SCHEDULE OPV FOR FOLLOW UP.

## 2019-11-09 NOTE — Telephone Encounter (Signed)
REVIEWED. Very much appreciate her kind words.

## 2019-11-09 NOTE — Telephone Encounter (Signed)
Pt was inquiring as to when her next colonoscopy was due.  Said she heard that Dr. Oneida Alar will be leaving in Sept and she just hates to see her leave and is aware it is too early for her next colonoscopy.( But she will be leaving in May 2021).  She is on recall for OV to come back in May.  Dr. Oneida Alar, is it OK to put her on the schedule to come back to see you before then just for follow up.  Please advise!

## 2020-03-07 DIAGNOSIS — G894 Chronic pain syndrome: Secondary | ICD-10-CM | POA: Diagnosis not present

## 2020-03-07 DIAGNOSIS — Z1389 Encounter for screening for other disorder: Secondary | ICD-10-CM | POA: Diagnosis not present

## 2020-03-07 DIAGNOSIS — E7849 Other hyperlipidemia: Secondary | ICD-10-CM | POA: Diagnosis not present

## 2020-03-07 DIAGNOSIS — J309 Allergic rhinitis, unspecified: Secondary | ICD-10-CM | POA: Diagnosis not present

## 2020-03-07 DIAGNOSIS — I10 Essential (primary) hypertension: Secondary | ICD-10-CM | POA: Diagnosis not present

## 2020-03-07 DIAGNOSIS — Z6841 Body Mass Index (BMI) 40.0 and over, adult: Secondary | ICD-10-CM | POA: Diagnosis not present

## 2020-03-07 DIAGNOSIS — F419 Anxiety disorder, unspecified: Secondary | ICD-10-CM | POA: Diagnosis not present

## 2020-03-07 DIAGNOSIS — Z0000 Encounter for general adult medical examination without abnormal findings: Secondary | ICD-10-CM | POA: Diagnosis not present

## 2020-06-27 DIAGNOSIS — Z20828 Contact with and (suspected) exposure to other viral communicable diseases: Secondary | ICD-10-CM | POA: Diagnosis not present

## 2020-07-08 ENCOUNTER — Other Ambulatory Visit: Payer: Self-pay | Admitting: Gastroenterology

## 2020-08-01 ENCOUNTER — Other Ambulatory Visit (HOSPITAL_COMMUNITY): Payer: Self-pay | Admitting: Internal Medicine

## 2020-08-01 DIAGNOSIS — Z1231 Encounter for screening mammogram for malignant neoplasm of breast: Secondary | ICD-10-CM

## 2020-08-11 ENCOUNTER — Ambulatory Visit (HOSPITAL_COMMUNITY)
Admission: RE | Admit: 2020-08-11 | Discharge: 2020-08-11 | Disposition: A | Discharge status: Home / Self Care | Payer: Medicare PPO | Source: Ambulatory Visit | Attending: Internal Medicine | Admitting: Internal Medicine

## 2020-08-11 ENCOUNTER — Other Ambulatory Visit: Payer: Self-pay

## 2020-08-11 DIAGNOSIS — Z1231 Encounter for screening mammogram for malignant neoplasm of breast: Secondary | ICD-10-CM | POA: Insufficient documentation

## 2020-11-29 DIAGNOSIS — Z6838 Body mass index (BMI) 38.0-38.9, adult: Secondary | ICD-10-CM | POA: Diagnosis not present

## 2020-11-29 DIAGNOSIS — I10 Essential (primary) hypertension: Secondary | ICD-10-CM | POA: Diagnosis not present

## 2020-11-29 DIAGNOSIS — F419 Anxiety disorder, unspecified: Secondary | ICD-10-CM | POA: Diagnosis not present

## 2020-11-29 DIAGNOSIS — H6992 Unspecified Eustachian tube disorder, left ear: Secondary | ICD-10-CM | POA: Diagnosis not present

## 2020-11-29 DIAGNOSIS — E7849 Other hyperlipidemia: Secondary | ICD-10-CM | POA: Diagnosis not present

## 2021-04-25 DIAGNOSIS — E782 Mixed hyperlipidemia: Secondary | ICD-10-CM | POA: Diagnosis not present

## 2021-04-25 DIAGNOSIS — I10 Essential (primary) hypertension: Secondary | ICD-10-CM | POA: Diagnosis not present

## 2021-04-25 DIAGNOSIS — Z6839 Body mass index (BMI) 39.0-39.9, adult: Secondary | ICD-10-CM | POA: Diagnosis not present

## 2021-04-25 DIAGNOSIS — Z1331 Encounter for screening for depression: Secondary | ICD-10-CM | POA: Diagnosis not present

## 2021-04-25 DIAGNOSIS — Z0001 Encounter for general adult medical examination with abnormal findings: Secondary | ICD-10-CM | POA: Diagnosis not present

## 2021-07-17 ENCOUNTER — Other Ambulatory Visit (HOSPITAL_COMMUNITY): Payer: Self-pay | Admitting: Internal Medicine

## 2021-07-17 DIAGNOSIS — Z1231 Encounter for screening mammogram for malignant neoplasm of breast: Secondary | ICD-10-CM

## 2021-08-16 ENCOUNTER — Ambulatory Visit (HOSPITAL_COMMUNITY)
Admission: RE | Admit: 2021-08-16 | Discharge: 2021-08-16 | Disposition: A | Discharge status: Home / Self Care | Payer: Medicare HMO | Source: Ambulatory Visit | Attending: Internal Medicine | Admitting: Internal Medicine

## 2021-08-16 ENCOUNTER — Other Ambulatory Visit: Payer: Self-pay

## 2021-08-16 DIAGNOSIS — Z1231 Encounter for screening mammogram for malignant neoplasm of breast: Secondary | ICD-10-CM | POA: Insufficient documentation

## 2022-01-02 DIAGNOSIS — F419 Anxiety disorder, unspecified: Secondary | ICD-10-CM | POA: Diagnosis not present

## 2022-01-02 DIAGNOSIS — I10 Essential (primary) hypertension: Secondary | ICD-10-CM | POA: Diagnosis not present

## 2022-07-04 DIAGNOSIS — E559 Vitamin D deficiency, unspecified: Secondary | ICD-10-CM | POA: Diagnosis not present

## 2022-07-04 DIAGNOSIS — D518 Other vitamin B12 deficiency anemias: Secondary | ICD-10-CM | POA: Diagnosis not present

## 2022-07-04 DIAGNOSIS — E039 Hypothyroidism, unspecified: Secondary | ICD-10-CM | POA: Diagnosis not present

## 2022-07-04 DIAGNOSIS — Z0001 Encounter for general adult medical examination with abnormal findings: Secondary | ICD-10-CM | POA: Diagnosis not present

## 2022-07-04 DIAGNOSIS — Z23 Encounter for immunization: Secondary | ICD-10-CM | POA: Diagnosis not present

## 2022-07-04 DIAGNOSIS — Z1331 Encounter for screening for depression: Secondary | ICD-10-CM | POA: Diagnosis not present

## 2022-07-04 DIAGNOSIS — Z6839 Body mass index (BMI) 39.0-39.9, adult: Secondary | ICD-10-CM | POA: Diagnosis not present

## 2022-07-27 ENCOUNTER — Other Ambulatory Visit (HOSPITAL_COMMUNITY): Payer: Self-pay | Admitting: Internal Medicine

## 2022-07-27 DIAGNOSIS — Z1231 Encounter for screening mammogram for malignant neoplasm of breast: Secondary | ICD-10-CM

## 2022-08-20 ENCOUNTER — Ambulatory Visit (HOSPITAL_COMMUNITY)
Admission: RE | Admit: 2022-08-20 | Discharge: 2022-08-20 | Disposition: A | Discharge status: Home / Self Care | Payer: Medicare HMO | Source: Ambulatory Visit | Attending: Internal Medicine | Admitting: Internal Medicine

## 2022-08-20 DIAGNOSIS — Z1231 Encounter for screening mammogram for malignant neoplasm of breast: Secondary | ICD-10-CM | POA: Insufficient documentation

## 2022-08-28 ENCOUNTER — Encounter: Payer: Self-pay | Admitting: *Deleted

## 2022-09-19 ENCOUNTER — Encounter: Payer: Self-pay | Admitting: *Deleted

## 2022-09-19 NOTE — Patient Instructions (Signed)
  Procedure: Colonoscopy Estimated body mass index is 34.87 kg/m as calculated from the following:   Height as of this encounter: 5' 3.5" (1.613 m).   Weight as of this encounter: 200 lb (90.7 kg).   Have you had a colonoscopy before?  09/13/17, Dr. Oneida Alar  Do you have family history of colon cancer?  Grandmother  Do you have a family history of polyps? tes  Previous colonoscopy with polyps removed? yes  Do you have a history colorectal cancer?   no  Are you diabetic?  no  Do you have a prosthetic or mechanical heart valve? no  Do you have a pacemaker/defibrillator?   no  Have you had endocarditis/atrial fibrillation?  no  Do you use supplemental oxygen/CPAP?  no  Have you had joint replacement within the last 12 months?  no  Do you tend to be constipated or have to use laxatives?  no   Do you have history of alcohol use? If yes, how much and how often.  no  Do you have history or are you using drugs? If yes, what do are you  using?  no  Have you ever had a stroke/heart attack?  no  Have you ever had a heart or other vascular stent placed,?no  Do you take weight loss medication? no  female patients,: have you had a hysterectomy? no                              are you post menopausal?                                do you still have your menstrual cycle? no    Date of last menstrual period? 2019  Do you take any blood-thinning medications such as: (Plavix, aspirin, Coumadin, Aggrenox, Brilinta, Xarelto, Eliquis, Pradaxa, Savaysa or Effient)? Aspirin 81 every other day  If yes we need the name, milligram, dosage and who is prescribing doctor:               Current Outpatient Medications  Medication Sig Dispense Refill   ALPRAZolam (XANAX) 0.5 MG tablet Take 0.5 mg 2 (two) times daily as needed by mouth (for anxiety).      aspirin EC 81 MG tablet Take 81 mg every other day by mouth. At bedtime.     ezetimibe (ZETIA) 10 MG tablet Take 10 mg at bedtime by mouth.       lisinopril (PRINIVIL,ZESTRIL) 10 MG tablet Take 10 mg by mouth daily.      simvastatin (ZOCOR) 40 MG tablet Take 40 mg daily by mouth.      No current facility-administered medications for this visit.    Allergies  Allergen Reactions   Moxifloxacin     SEVERE DIARRHEA/ABDOMINAL PAIN

## 2022-10-15 NOTE — Progress Notes (Signed)
Ok to schedule. ASA 2.  

## 2022-10-16 NOTE — Progress Notes (Signed)
LMOVM to call back to schedule with Dr. Abbey Chatters

## 2022-10-25 ENCOUNTER — Encounter: Payer: Self-pay | Admitting: *Deleted

## 2022-10-26 ENCOUNTER — Encounter: Payer: Self-pay | Admitting: *Deleted

## 2022-10-26 MED ORDER — PEG 3350-KCL-NA BICARB-NACL 420 G PO SOLR: 4000.0000 mL | Freq: Once | ORAL | 0 refills | Status: AC

## 2022-10-26 NOTE — Progress Notes (Signed)
Pt has been scheduled for 11/27/22 at 9:30 am. Instructions mailed and prep sent to the pharmacy.  [employer] PA: Approved Authorization #[id number]  Tracking [id number]

## 2022-11-27 ENCOUNTER — Ambulatory Visit (HOSPITAL_COMMUNITY): Payer: Medicare HMO | Admitting: Anesthesiology

## 2022-11-27 ENCOUNTER — Ambulatory Visit (HOSPITAL_COMMUNITY)
Admission: RE | Admit: 2022-11-27 | Discharge: 2022-11-27 | Disposition: A | Discharge status: Home / Self Care | Payer: Medicare HMO | Source: Ambulatory Visit | Attending: Internal Medicine | Admitting: Internal Medicine

## 2022-11-27 ENCOUNTER — Encounter (HOSPITAL_COMMUNITY)
Admission: RE | Disposition: A | Discharge status: Home / Self Care | Payer: Self-pay | Source: Ambulatory Visit | Attending: Internal Medicine

## 2022-11-27 ENCOUNTER — Other Ambulatory Visit: Payer: Self-pay

## 2022-11-27 ENCOUNTER — Ambulatory Visit (HOSPITAL_BASED_OUTPATIENT_CLINIC_OR_DEPARTMENT_OTHER): Payer: Medicare HMO | Admitting: Anesthesiology

## 2022-11-27 ENCOUNTER — Encounter (HOSPITAL_COMMUNITY): Payer: Self-pay

## 2022-11-27 DIAGNOSIS — I10 Essential (primary) hypertension: Secondary | ICD-10-CM | POA: Insufficient documentation

## 2022-11-27 DIAGNOSIS — K648 Other hemorrhoids: Secondary | ICD-10-CM | POA: Diagnosis not present

## 2022-11-27 DIAGNOSIS — D125 Benign neoplasm of sigmoid colon: Secondary | ICD-10-CM

## 2022-11-27 DIAGNOSIS — Z87891 Personal history of nicotine dependence: Secondary | ICD-10-CM | POA: Diagnosis not present

## 2022-11-27 DIAGNOSIS — K635 Polyp of colon: Secondary | ICD-10-CM | POA: Diagnosis not present

## 2022-11-27 DIAGNOSIS — Z09 Encounter for follow-up examination after completed treatment for conditions other than malignant neoplasm: Secondary | ICD-10-CM | POA: Diagnosis not present

## 2022-11-27 DIAGNOSIS — Z86010 Personal history of colonic polyps: Secondary | ICD-10-CM

## 2022-11-27 DIAGNOSIS — Z79899 Other long term (current) drug therapy: Secondary | ICD-10-CM | POA: Diagnosis not present

## 2022-11-27 DIAGNOSIS — Z1211 Encounter for screening for malignant neoplasm of colon: Secondary | ICD-10-CM | POA: Insufficient documentation

## 2022-11-27 HISTORY — PX: POLYPECTOMY: SHX5525

## 2022-11-27 HISTORY — PX: COLONOSCOPY WITH PROPOFOL: SHX5780

## 2022-11-27 SURGERY — COLONOSCOPY WITH PROPOFOL
Anesthesia: General

## 2022-11-27 MED ORDER — LACTATED RINGERS IV SOLN: INTRAVENOUS | Status: DC

## 2022-11-27 MED ORDER — LIDOCAINE HCL (CARDIAC) PF 100 MG/5ML IV SOSY
PREFILLED_SYRINGE | INTRAVENOUS | Status: DC | PRN
  Administered 2022-11-27: 50 mg via INTRATRACHEAL

## 2022-11-27 MED ORDER — PROPOFOL 500 MG/50ML IV EMUL
INTRAVENOUS | Status: DC | PRN
  Administered 2022-11-27: 200 ug/kg/min via INTRAVENOUS

## 2022-11-27 MED ORDER — PROPOFOL 10 MG/ML IV BOLUS
INTRAVENOUS | Status: DC | PRN
  Administered 2022-11-27: 100 mg via INTRAVENOUS

## 2022-11-27 NOTE — Transfer of Care (Signed)
Immediate Anesthesia Transfer of Care Note  Patient: [name]  Procedure(s) Performed: COLONOSCOPY WITH PROPOFOL POLYPECTOMY  Patient Location: Endoscopy Unit  Anesthesia Type:General  Level of Consciousness: awake, alert , oriented, and patient cooperative  Airway & Oxygen Therapy: Patient Spontanous Breathing  Post-op Assessment: Report given to RN, Post -op Vital signs reviewed and stable, and Patient moving all extremities  Post vital signs: Reviewed and stable  Last Vitals:  Vitals Value Taken Time  BP    Temp    Pulse    Resp    SpO2      Last Pain:  Vitals:   11/27/22 0941  TempSrc:   PainSc: 0-No pain      Patients Stated Pain Goal: 8 (64/40/34 7425)  Complications: No notable events documented.

## 2022-11-27 NOTE — Anesthesia Preprocedure Evaluation (Addendum)
Anesthesia Evaluation  Patient identified by MRN, date of birth, ID band Patient awake    Reviewed: Allergy & Precautions, H&P , NPO status , Patient's Chart, lab work & pertinent test results  Airway Mallampati: II  TM Distance: >3 FB Neck ROM: Full    Dental  (+) Dental Advisory Given, Teeth Intact   Pulmonary former smoker   Pulmonary exam normal breath sounds clear to auscultation       Cardiovascular hypertension, Pt. on medications Normal cardiovascular exam Rhythm:Regular Rate:Normal     Neuro/Psych  Neuromuscular disease  negative psych ROS   GI/Hepatic negative GI ROS, Neg liver ROS,,,  Endo/Other  negative endocrine ROS    Renal/GU negative Renal ROS  negative genitourinary   Musculoskeletal negative musculoskeletal ROS (+)    Abdominal   Peds negative pediatric ROS (+)  Hematology negative hematology ROS (+)   Anesthesia Other Findings   Reproductive/Obstetrics negative OB ROS                             Anesthesia Physical Anesthesia Plan  ASA: 2  Anesthesia Plan: General   Post-op Pain Management: Minimal or no pain anticipated   Induction: Intravenous  PONV Risk Score and Plan: 1 and Propofol infusion  Airway Management Planned: Nasal Cannula and Natural Airway  Additional Equipment:   Intra-op Plan:   Post-operative Plan:   Informed Consent: I have reviewed the patients History and Physical, chart, labs and discussed the procedure including the risks, benefits and alternatives for the proposed anesthesia with the patient or authorized representative who has indicated his/her understanding and acceptance.     Dental advisory given  Plan Discussed with: CRNA and Surgeon  Anesthesia Plan Comments:        Anesthesia Quick Evaluation

## 2022-11-27 NOTE — Anesthesia Postprocedure Evaluation (Signed)
Anesthesia Post Note  Patient: Jakyia Gaccione Probasco  Procedure(s) Performed: COLONOSCOPY WITH PROPOFOL POLYPECTOMY  Patient location during evaluation: Phase II Anesthesia Type: General Level of consciousness: awake and alert and oriented Pain management: pain level controlled Vital Signs Assessment: post-procedure vital signs reviewed and stable Respiratory status: spontaneous breathing, nonlabored ventilation and respiratory function stable Cardiovascular status: blood pressure returned to baseline and stable Postop Assessment: no apparent nausea or vomiting Anesthetic complications: no  No notable events documented.   Last Vitals:  Vitals:   11/27/22 1004 11/27/22 1005  BP: 92/64 104/80  Pulse:  90  Resp:  18  Temp:    SpO2:      Last Pain:  Vitals:   11/27/22 1005  TempSrc:   PainSc: 0-No pain                 Kalaysia Demonbreun C Colston Pyle

## 2022-11-27 NOTE — H&P (Signed)
Primary Care Physician:  Redmond School, MD Primary Gastroenterologist:  Dr. Abbey Chatters  Pre-Procedure History & Physical: HPI:  [name] is a 62 y.o. female is here for a colonoscopy to be performed for surveillance purposes, personal history of sessile serrated adenoma 2018.   Past Medical History:  Diagnosis Date   Anal fissure    Carpal tunnel syndrome on both sides    Hyperlipidemia    Hypertension     Past Surgical History:  Procedure Laterality Date   CARPAL TUNNEL RELEASE Bilateral MAY/JUL 2004   COLONOSCOPY  05/20/2007   [id number] colon   COLONOSCOPY N/A 09/13/2017   Procedure: COLONOSCOPY;  Surgeon: Danie Binder, MD;  [facility];  Service: Endoscopy;  Laterality: N/A;  10:30 AM    Prior to Admission medications   Medication Sig Start Date End Date Taking? Authorizing Provider  acetaminophen (TYLENOL) 500 MG tablet Take 500 mg by mouth every 6 (six) hours as needed for moderate pain.   Yes [provider]  ALPRAZolam Duanne Moron) 0.5 MG tablet Take 0.5 mg 2 (two) times daily as needed by mouth (for anxiety).  02/04/13  Yes [provider]  aspirin EC 81 MG tablet Take 81 mg every other day by mouth. At bedtime.   Yes [provider]  ezetimibe (ZETIA) 10 MG tablet Take 5 mg by mouth at bedtime.   Yes [provider]  lisinopril (PRINIVIL,ZESTRIL) 10 MG tablet Take 10 mg by mouth daily.  12/15/12  Yes [provider]  simvastatin (ZOCOR) 40 MG tablet Take 40 mg by mouth at bedtime. 12/15/12  Yes [provider]  fluticasone (FLONASE) 50 MCG/ACT nasal spray Place 1 spray into both nostrils daily as needed for allergies or rhinitis.    [provider]  lactase (LACTAID) 3000 units tablet Take 3,000 Units by mouth daily as needed (when eating ice cream).    [provider]    Allergies as of 10/26/2022 - Review Complete 04/02/2019  Allergen Reaction Noted   Moxifloxacin  04/02/2019     Family History  Problem Relation Age of Onset   Colon cancer Maternal Grandmother    Breast cancer Mother    Kidney failure Father    Breast cancer Sister    Melanoma Sister    Colon polyps Neg Hx     Social History   Socioeconomic History   Marital status: Married    Spouse name: Not on file   Number of children: Not on file   Years of education: Not on file   Highest education level: Not on file  Occupational History   Not on file  Tobacco Use   Smoking status: Former   Smokeless tobacco: Never   Tobacco comments:    quit 1990's  Vaping Use   Vaping Use: Never used  Substance and Sexual Activity   Alcohol use: No   Drug use: No   Sexual activity: Not on file  Other Topics Concern   Not on file  Social History Narrative   Not on file   Social Determinants of Health   Financial Resource Strain: Not on file  Food Insecurity: Not on file  Transportation Needs: Not on file  Physical Activity: Not on file  Stress: Not on file  Social Connections: Not on file  Intimate Partner Violence: Not on file    Review of Systems: See HPI, otherwise negative ROS  Physical Exam: Vital signs in last 24 hours: Temp:  [97.8 F (36.6 C)]  97.8 F (36.6 C) (01/30 0756) Pulse Rate:  [103] 103 (01/30 0756) Resp:  [21] 21 (01/30 0756) BP: (148)/(86) 148/86 (01/30 0756) SpO2:  [96 %] 96 % (01/30 0756) Weight:  [90.7 kg] 90.7 kg (01/30 0756)   General:   Alert,  Well-developed, well-nourished, pleasant and cooperative in NAD Head:  Normocephalic and atraumatic. Eyes:  Sclera clear, no icterus.   Conjunctiva pink. Ears:  Normal auditory acuity. Nose:  No deformity, discharge,  or lesions. Msk:  Symmetrical without gross deformities. Normal posture. Extremities:  Without clubbing or edema. Neurologic:  Alert and  oriented x4;  grossly normal neurologically. Skin:  Intact without significant lesions or rashes. Psych:  Alert and cooperative. Normal mood and  affect.  Impression/Plan: [name] is here for a colonoscopy to be performed for surveillance purposes, personal history of sessile serrated adenoma 2018.   The risks of the procedure including infection, bleed, or perforation as well as benefits, limitations, alternatives and imponderables have been reviewed with the patient. Questions have been answered. All parties agreeable.

## 2022-11-27 NOTE — Op Note (Signed)
[facility] Patient Name: [name] Procedure Date: 11/27/2022 8:14 AM MRN: [id number] Date of Birth: 01/07/1961 Attending MD: Elon Alas. Abbey Chatters , [place], [phone] CSN: [id number] Age: 62 Admit Type: Outpatient Procedure:                Colonoscopy Indications:              High risk colon cancer surveillance: Personal                            history of traditional serrated adenoma of the colon Providers:                Elon Alas. Abbey Chatters, DO, Lambert Mody, Illene Labrador Referring MD:              Medicines:                See the Anesthesia note for documentation of the                            administered medications Complications:            No immediate complications. Estimated Blood Loss:     Estimated blood loss was minimal. Procedure:                Pre-Anesthesia Assessment:                           - The anesthesia plan was to use monitored                            anesthesia care (MAC).                           After obtaining informed consent, the colonoscope                            was passed under direct vision. Throughout the                            procedure, the patient's blood pressure, pulse, and                            oxygen saturations were monitored continuously. The                            PCF-HQ190L ([id number]) scope was introduced through                            the anus and advanced to the the terminal ileum,                            with identification of the appendiceal orifice and                            IC valve. The colonoscopy  was performed without                            difficulty. The patient tolerated the procedure                            well. The quality of the bowel preparation was                            evaluated using the BBPS [facility] Bowel Preparation                            Scale) with scores of: Right Colon = 3, Transverse                            Colon = 3  and Left Colon = 3 (entire mucosa seen                            well with no residual staining, small fragments of                            stool or opaque liquid). The total BBPS score                            equals 9. Scope In: 9:45:10 AM Scope Out: 9:56:36 AM Scope Withdrawal Time: 0 hours 9 minutes 38 seconds  Total Procedure Duration: 0 hours 11 minutes 26 seconds  Findings:      Hemorrhoids were found on perianal exam.      Non-bleeding internal hemorrhoids were found during endoscopy.      A 4 mm polyp was found in the sigmoid colon. The polyp was sessile. The       polyp was removed with a cold snare. Resection and retrieval were       complete.      The terminal ileum appeared normal.      The exam was otherwise without abnormality. Impression:               - Hemorrhoids found on perianal exam.                           - Non-bleeding internal hemorrhoids.                           - One 4 mm polyp in the sigmoid colon, removed with                            a cold snare. Resected and retrieved.                           - The examined portion of the ileum was normal.                           - The examination was otherwise normal. Moderate Sedation:      Per Anesthesia Care Recommendation:           -  Patient has a contact number available for                            emergencies. The signs and symptoms of potential                            delayed complications were discussed with the                            patient. Return to normal activities tomorrow.                            Written discharge instructions were provided to the                            patient.                           - Resume previous diet.                           - Continue present medications.                           - Await pathology results.                           - Repeat colonoscopy in 5-10 years for surveillance.                           - Return to GI clinic  PRN. Procedure Code(s):        --- Professional ---                           [phone], Colonoscopy, flexible; with removal of                            tumor(s), polyp(s), or other lesion(s) by snare                            technique Diagnosis Code(s):        --- Professional ---                           Z86.010, Personal history of colonic polyps                           D12.5, Benign neoplasm of sigmoid colon                           K64.8, Other hemorrhoids CPT copyright 2022 American Medical Association. All rights reserved. The codes documented in this report are preliminary and upon coder review may  be revised to meet current compliance requirements. Elon Alas. Abbey Chatters, DO [place] Fiza Nation, DO 11/27/2022 10:00:55 AM This report has been signed electronically. Number of Addenda: 0

## 2022-11-27 NOTE — Discharge Instructions (Addendum)
  Colonoscopy Discharge Instructions  Read the instructions outlined below and refer to this sheet in the next few weeks. These discharge instructions provide you with general information on caring for yourself after you leave the hospital. Your doctor may also give you specific instructions. While your treatment has been planned according to the most current medical practices available, unavoidable complications occasionally occur.   ACTIVITY You may resume your regular activity, but move at a slower pace for the next 24 hours.  Take frequent rest periods for the next 24 hours.  Walking will help get rid of the air and reduce the bloated feeling in your belly (abdomen).  No driving for 24 hours (because of the medicine (anesthesia) used during the test).   Do not sign any important legal documents or operate any machinery for 24 hours (because of the anesthesia used during the test).  NUTRITION Drink plenty of fluids.  You may resume your normal diet as instructed by your doctor.  Begin with a light meal and progress to your normal diet. Heavy or fried foods are harder to digest and may make you feel sick to your stomach (nauseated).  Avoid alcoholic beverages for 24 hours or as instructed.  MEDICATIONS You may resume your normal medications unless your doctor tells you otherwise.  WHAT YOU CAN EXPECT TODAY Some feelings of bloating in the abdomen.  Passage of more gas than usual.  Spotting of blood in your stool or on the toilet paper.  IF YOU HAD POLYPS REMOVED DURING THE COLONOSCOPY: No aspirin products for 7 days or as instructed.  No alcohol for 7 days or as instructed.  Eat a soft diet for the next 24 hours.  FINDING OUT THE RESULTS OF YOUR TEST Not all test results are available during your visit. If your test results are not back during the visit, make an appointment with your caregiver to find out the results. Do not assume everything is normal if you have not heard from your  caregiver or the medical facility. It is important for you to follow up on all of your test results.  SEEK IMMEDIATE MEDICAL ATTENTION IF: You have more than a spotting of blood in your stool.  Your belly is swollen (abdominal distention).  You are nauseated or vomiting.  You have a temperature over 101.  You have abdominal pain or discomfort that is severe or gets worse throughout the day.   Your colonoscopy revealed 1 polyp(s) which I removed successfully. Await pathology results, my office will contact you. I recommend repeating colonoscopy in 5-10 years for surveillance purposes. Otherwise follow up with Gi as needed   I hope you have a great rest of your week!  Elon Alas. Abbey Chatters, D.O. Gastroenterology and Hepatology [facility]

## 2022-11-29 LAB — SURGICAL PATHOLOGY

## 2022-12-03 ENCOUNTER — Encounter (HOSPITAL_COMMUNITY): Payer: Self-pay | Admitting: Internal Medicine

## 2022-12-28 DIAGNOSIS — Z0001 Encounter for general adult medical examination with abnormal findings: Secondary | ICD-10-CM | POA: Diagnosis not present

## 2022-12-28 DIAGNOSIS — E559 Vitamin D deficiency, unspecified: Secondary | ICD-10-CM | POA: Diagnosis not present

## 2022-12-28 DIAGNOSIS — D518 Other vitamin B12 deficiency anemias: Secondary | ICD-10-CM | POA: Diagnosis not present

## 2022-12-28 DIAGNOSIS — E039 Hypothyroidism, unspecified: Secondary | ICD-10-CM | POA: Diagnosis not present

## 2023-01-01 DIAGNOSIS — F419 Anxiety disorder, unspecified: Secondary | ICD-10-CM | POA: Diagnosis not present

## 2023-01-01 DIAGNOSIS — I10 Essential (primary) hypertension: Secondary | ICD-10-CM | POA: Diagnosis not present

## 2023-01-01 DIAGNOSIS — J0100 Acute maxillary sinusitis, unspecified: Secondary | ICD-10-CM | POA: Diagnosis not present

## 2023-07-15 ENCOUNTER — Other Ambulatory Visit (HOSPITAL_COMMUNITY): Payer: Self-pay | Admitting: Internal Medicine

## 2023-07-15 DIAGNOSIS — Z1231 Encounter for screening mammogram for malignant neoplasm of breast: Secondary | ICD-10-CM

## 2023-07-16 DIAGNOSIS — H35033 Hypertensive retinopathy, bilateral: Secondary | ICD-10-CM | POA: Diagnosis not present

## 2023-07-16 DIAGNOSIS — H524 Presbyopia: Secondary | ICD-10-CM | POA: Diagnosis not present

## 2023-08-22 DIAGNOSIS — Z23 Encounter for immunization: Secondary | ICD-10-CM | POA: Diagnosis not present

## 2023-08-22 DIAGNOSIS — F419 Anxiety disorder, unspecified: Secondary | ICD-10-CM | POA: Diagnosis not present

## 2023-08-22 DIAGNOSIS — Z0001 Encounter for general adult medical examination with abnormal findings: Secondary | ICD-10-CM | POA: Diagnosis not present

## 2023-08-22 DIAGNOSIS — Z1231 Encounter for screening mammogram for malignant neoplasm of breast: Secondary | ICD-10-CM | POA: Diagnosis not present

## 2023-08-22 DIAGNOSIS — I10 Essential (primary) hypertension: Secondary | ICD-10-CM | POA: Diagnosis not present

## 2023-08-22 DIAGNOSIS — Z6841 Body Mass Index (BMI) 40.0 and over, adult: Secondary | ICD-10-CM | POA: Diagnosis not present

## 2023-08-22 DIAGNOSIS — E782 Mixed hyperlipidemia: Secondary | ICD-10-CM | POA: Diagnosis not present

## 2023-08-22 DIAGNOSIS — Z1331 Encounter for screening for depression: Secondary | ICD-10-CM | POA: Diagnosis not present

## 2023-08-23 ENCOUNTER — Ambulatory Visit (HOSPITAL_COMMUNITY)
Admission: RE | Admit: 2023-08-23 | Discharge: 2023-08-23 | Disposition: A | Discharge status: Home / Self Care | Payer: Medicare HMO | Source: Ambulatory Visit | Attending: Internal Medicine | Admitting: Internal Medicine

## 2023-08-23 ENCOUNTER — Encounter (HOSPITAL_COMMUNITY): Payer: Self-pay

## 2023-08-23 DIAGNOSIS — Z1231 Encounter for screening mammogram for malignant neoplasm of breast: Secondary | ICD-10-CM | POA: Insufficient documentation

## 2023-08-23 DIAGNOSIS — Z0100 Encounter for examination of eyes and vision without abnormal findings: Secondary | ICD-10-CM | POA: Diagnosis not present

## 2023-09-05 DIAGNOSIS — E538 Deficiency of other specified B group vitamins: Secondary | ICD-10-CM | POA: Diagnosis not present

## 2023-09-05 DIAGNOSIS — D518 Other vitamin B12 deficiency anemias: Secondary | ICD-10-CM | POA: Diagnosis not present

## 2023-09-05 DIAGNOSIS — E559 Vitamin D deficiency, unspecified: Secondary | ICD-10-CM | POA: Diagnosis not present

## 2023-09-05 DIAGNOSIS — E039 Hypothyroidism, unspecified: Secondary | ICD-10-CM | POA: Diagnosis not present

## 2023-09-05 DIAGNOSIS — Z0001 Encounter for general adult medical examination with abnormal findings: Secondary | ICD-10-CM | POA: Diagnosis not present

## 2023-09-06 DIAGNOSIS — I10 Essential (primary) hypertension: Secondary | ICD-10-CM | POA: Diagnosis not present

## 2023-09-06 DIAGNOSIS — Z6841 Body Mass Index (BMI) 40.0 and over, adult: Secondary | ICD-10-CM | POA: Diagnosis not present

## 2023-09-06 DIAGNOSIS — Z01419 Encounter for gynecological examination (general) (routine) without abnormal findings: Secondary | ICD-10-CM | POA: Diagnosis not present

## 2023-09-09 ENCOUNTER — Other Ambulatory Visit (HOSPITAL_COMMUNITY): Payer: Self-pay | Admitting: Internal Medicine

## 2023-09-09 DIAGNOSIS — R748 Abnormal levels of other serum enzymes: Secondary | ICD-10-CM

## 2023-09-13 LAB — CYTOLOGY - PAP

## 2023-09-19 ENCOUNTER — Ambulatory Visit (HOSPITAL_COMMUNITY)
Admission: RE | Admit: 2023-09-19 | Discharge: 2023-09-19 | Disposition: A | Discharge status: Home / Self Care | Payer: Medicare HMO | Source: Ambulatory Visit | Attending: Internal Medicine | Admitting: Internal Medicine

## 2023-09-19 DIAGNOSIS — R748 Abnormal levels of other serum enzymes: Secondary | ICD-10-CM | POA: Diagnosis not present

## 2023-09-19 DIAGNOSIS — R7989 Other specified abnormal findings of blood chemistry: Secondary | ICD-10-CM | POA: Diagnosis not present

## 2024-02-03 DIAGNOSIS — Z6841 Body Mass Index (BMI) 40.0 and over, adult: Secondary | ICD-10-CM | POA: Diagnosis not present

## 2024-02-03 DIAGNOSIS — I10 Essential (primary) hypertension: Secondary | ICD-10-CM | POA: Diagnosis not present

## 2024-02-03 DIAGNOSIS — N950 Postmenopausal bleeding: Secondary | ICD-10-CM | POA: Diagnosis not present

## 2024-02-03 DIAGNOSIS — R102 Pelvic and perineal pain: Secondary | ICD-10-CM | POA: Diagnosis not present

## 2024-02-03 DIAGNOSIS — F419 Anxiety disorder, unspecified: Secondary | ICD-10-CM | POA: Diagnosis not present

## 2024-02-07 ENCOUNTER — Other Ambulatory Visit (HOSPITAL_COMMUNITY): Payer: Self-pay | Admitting: Internal Medicine

## 2024-02-07 DIAGNOSIS — N950 Postmenopausal bleeding: Secondary | ICD-10-CM

## 2024-02-21 ENCOUNTER — Ambulatory Visit (HOSPITAL_COMMUNITY)
Admission: RE | Admit: 2024-02-21 | Discharge: 2024-02-21 | Disposition: A | Discharge status: Home / Self Care | Source: Ambulatory Visit | Attending: Internal Medicine | Admitting: Internal Medicine

## 2024-02-21 DIAGNOSIS — N950 Postmenopausal bleeding: Secondary | ICD-10-CM | POA: Insufficient documentation

## 2024-02-21 DIAGNOSIS — R9389 Abnormal findings on diagnostic imaging of other specified body structures: Secondary | ICD-10-CM | POA: Diagnosis not present

## 2024-03-11 ENCOUNTER — Other Ambulatory Visit (HOSPITAL_COMMUNITY)
Admission: RE | Admit: 2024-03-11 | Discharge: 2024-03-11 | Disposition: A | Discharge status: Home / Self Care | Source: Ambulatory Visit | Attending: Obstetrics & Gynecology | Admitting: Obstetrics & Gynecology

## 2024-03-11 ENCOUNTER — Encounter: Payer: Self-pay | Admitting: Adult Health

## 2024-03-11 ENCOUNTER — Ambulatory Visit: Admitting: Adult Health

## 2024-03-11 ENCOUNTER — Ambulatory Visit: Admitting: Obstetrics & Gynecology

## 2024-03-11 ENCOUNTER — Encounter: Payer: Self-pay | Admitting: Obstetrics & Gynecology

## 2024-03-11 VITALS — BP 153/96 | HR 97 | Ht 64.0 in | Wt 222.2 lb

## 2024-03-11 VITALS — BP 146/85 | HR 88 | Ht 64.0 in | Wt 224.0 lb

## 2024-03-11 DIAGNOSIS — I10 Essential (primary) hypertension: Secondary | ICD-10-CM

## 2024-03-11 DIAGNOSIS — Z1331 Encounter for screening for depression: Secondary | ICD-10-CM | POA: Diagnosis not present

## 2024-03-11 DIAGNOSIS — N950 Postmenopausal bleeding: Secondary | ICD-10-CM | POA: Diagnosis not present

## 2024-03-11 DIAGNOSIS — R9389 Abnormal findings on diagnostic imaging of other specified body structures: Secondary | ICD-10-CM

## 2024-03-11 DIAGNOSIS — C541 Malignant neoplasm of endometrium: Secondary | ICD-10-CM | POA: Diagnosis not present

## 2024-03-11 NOTE — Progress Notes (Signed)
 GYN VISIT Patient name: [name] MRN [id number]  Date of birth: 02/12/1961 Chief Complaint:   No chief complaint on file.  History of Present Illness:   [name] is a 63 y.o. G0P0000 PM female being seen today for follow up regarding:  PMB: Patient seen by J.Griffin due to postmenopausal bleeding.  She had reported 3 days of pink spotting and cramping back in April.  Intermittent pink spotting that has continued- maybe 2x in May- last noticed this past Monday.  Still having some mild cramping.  No urinary concerns.  No LMP recorded. Patient is postmenopausal.  Recent ultrasound 02-21-2024: 7.2 x 2.2 x 4.2cm uterus.  Endometrial stripe measuring 19 mm.  Normal ovaries bilaterally  Review of Systems:   Pertinent items are noted in HPI Denies fever/chills, dizziness, headaches, visual disturbances, fatigue, shortness of breath, chest pain, abdominal pain, vomiting Pertinent History Reviewed:   Past Surgical History:  Procedure Laterality Date   BREAST BIOPSY Left 06/23/2012   fibroadenomatoid change with calcifications, fibrocystic change with usual ductal hyperplasia, and pseudoangiomatous stromal hyperplasia (PASH)   CARPAL TUNNEL RELEASE Bilateral MAY/JUL 2004   COLONOSCOPY  05/20/2007   [id number] colon   COLONOSCOPY N/A 09/13/2017   Procedure: COLONOSCOPY;  Surgeon: Alyce Jubilee, MD;  [facility]: [facility];  Service: Endoscopy;  Laterality: N/A;  10:30 AM   COLONOSCOPY WITH PROPOFOL  N/A 11/27/2022   Procedure: COLONOSCOPY WITH PROPOFOL ;  Surgeon: Vinetta Greening, DO;  [facility];  Service: Endoscopy;  Laterality: N/A;  9:30 am   POLYPECTOMY  11/27/2022   Procedure: POLYPECTOMY;  Surgeon: Vinetta Greening, DO;  [facility]: [facility];  Service: Endoscopy;;    Past Medical History:  Diagnosis Date   Anal fissure    Carpal tunnel syndrome on both sides    Hyperlipidemia    Hypertension    Reviewed problem list, medications and  allergies. Physical Assessment:   Vitals:   03/11/24 1448 03/11/24 1520  BP: (!) 155/90 (!) 153/96  Pulse: 97   Weight: 222 lb 3.2 oz (100.8 kg)   Height: 5\' 4"  (1.626 m)   Body mass index is 38.14 kg/m.       Physical Examination:   General appearance: alert, well appearing, and in no distress  Psych: mood appropriate, normal affect  Skin: warm & dry   Cardiovascular: normal heart rate noted  Respiratory: normal respiratory effort, no distress  Abdomen: soft, non-tender   Pelvic: VULVA: normal appearing vulva with no masses, tenderness or lesions, VAGINA: normal appearing vagina with normal color and discharge, no lesions, CERVIX: normal appearing cervix without discharge or lesions, UTERUS: uterus is normal size, shape, consistency and nontender  Extremities: no edema   Chaperone: Wendell Halt    Endometrial Biopsy Procedure Note  Pre-operative Diagnosis: PMB, thickened endometrium  Post-operative Diagnosis: same  Procedure Details    The risks (including infection, bleeding, pain, and uterine perforation) and benefits of the procedure were explained to the patient and Written informed consent was obtained.  Antibiotic prophylaxis against endocarditis was not indicated.   The patient was placed in the dorsal lithotomy position.  Bimanual exam showed the uterus to be in the neutral position.  A speculum inserted in the vagina, and the cervix prepped with betadine.     A single tooth tenaculum was applied to the anterior lip of the cervix for stabilization.  Os finder was used.  A Pipelle endometrial aspirator was used to sample the endometrium.  Sample  was sent for pathologic examination.  Condition: Stable  Complications: None    Assessment & Plan:  1) postmenopausal bleeding, thickened endometrium - Reviewed ultrasound findings and discussed underlying etiology ranging from atrophy to endometrial carcinoma - Discussed recommendation to proceed with EMB, informed  consent obtained, procedure is completed above -Next step pending results of pathology.   The patient was advised to call for any fever or for prolonged or severe pain or bleeding. She was advised to use OTC analgesics as needed for mild to moderate pain. She was advised to avoid vaginal intercourse for 48 hours or until the bleeding has completely stopped. - Discussed potential concern for endometrial carcinoma and briefly discussed management including referral to GYN oncology for surgical intervention  No orders of the defined types were placed in this encounter.   Return for TBD.   Brinton Brandel, DO Attending Obstetrician & Gynecologist, [facility] for [employer], [facility]

## 2024-03-11 NOTE — Progress Notes (Signed)
  Subjective:     Patient ID: [name], female   DOB: 1961/02/13, 63 y.o.   MRN: [id number]  HPI [name] is a 63 year old white female, married, PM in for having PMB 3 days first of April, was pink and had cramping. She saw PCP and had US  02/21/24, that showed tiny uterus and normal ovaries with thickened endometrium 19 mm. Her sister [name] is with her.   Last pap was 09/13/23 and negative.  PCP is Dr [name]  Review of Systems +PMB 3 days first of April, was pink and had cramping.   Reviewed past medical,surgical, social and family history. Reviewed medications and allergies.  Objective:   Physical Exam BP (!) 146/85 (BP Location: Left Arm, Patient Position: Sitting, Cuff Size: Large)   Pulse 88   Ht 5\' 4"  (1.626 m)   Wt 224 lb (101.6 kg)   BMI 38.45 kg/m   Skin warm and dry. Lungs: clear to ausculation bilaterally. Cardiovascular: regular rate and rhythm.  AA is 0 Fall risk is low    03/11/2024   10:47 AM  Depression screen PHQ 2/9  Decreased Interest 2  Down, Depressed, Hopeless 3  PHQ - 2 Score 5  Altered sleeping 3  Tired, decreased energy 2  Change in appetite 2  Feeling bad or failure about yourself  2  Trouble concentrating 2  Moving slowly or fidgety/restless 1  Suicidal thoughts 1  PHQ-9 Score 18   She is nervous, has xanax    03/11/2024   10:47 AM  GAD 7 : Generalized Anxiety Score  Nervous, Anxious, on Edge 3  Control/stop worrying 3  Worry too much - different things 3  Trouble relaxing 3  Restless 2  Easily annoyed or irritable 1  Afraid - awful might happen 2  Total GAD 7 Score 17      Upstream - 03/11/24 1042       Pregnancy Intention Screening   Does the patient want to become pregnant in the next year? N/A    Does the patient's partner want to become pregnant in the next year? N/A    Would the patient like to discuss contraceptive options today? N/A      Contraception Wrap Up   Current Method No Method - Other Reason   PM   End Method  No Method - Other Reason   PM   Contraception Counseling Provided No                Assessment:     1. PMB (postmenopausal bleeding) (Primary) PMB 3 days first of April, was pink and had cramping.  2. Hypertension, unspecified type Take meds and follow up with PCP  3. Thickened endometrium Endometrium was 19 mm on US , will get endometrial biopsy  to rule out endometrial cancer, polyp, hyperplasia, pt is aware if cancer will see GYN Oncology  Dr [name] has opening this afternoon, will return at 2:50 pm    Review handout  Plan:     Return this afternoon for endo biopsy

## 2024-03-13 LAB — SURGICAL PATHOLOGY

## 2024-03-16 ENCOUNTER — Ambulatory Visit: Payer: Self-pay | Admitting: Obstetrics & Gynecology

## 2024-03-16 ENCOUNTER — Other Ambulatory Visit: Payer: Self-pay | Admitting: Obstetrics & Gynecology

## 2024-03-16 DIAGNOSIS — C541 Malignant neoplasm of endometrium: Secondary | ICD-10-CM

## 2024-03-17 ENCOUNTER — Telehealth: Payer: Self-pay

## 2024-03-17 NOTE — Telephone Encounter (Signed)
 Spoke with the patient regarding the referral to GYN oncology. Patient scheduled as new patient with Dr Orvil Bland on 04/09/2024. Patient given an arrival time of 9:15am.  Explained to the patient the the doctor will perform a pelvic exam at this visit. Patient given the policy that only one visitor allowed and that visitor must be over 16 yrs are allowed in the Cancer Center. Patient given the address/phone number for the clinic and that the center offers free valet service. Patient aware that masks required.

## 2024-03-26 ENCOUNTER — Encounter: Payer: Self-pay | Admitting: Gynecologic Oncology

## 2024-03-26 ENCOUNTER — Telehealth: Payer: Self-pay

## 2024-03-26 NOTE — Telephone Encounter (Signed)
 Spoke with patient and had her appointment moved up per provider to 03/27/2024.. patient confirmed and understood.Aaron Aas

## 2024-03-26 NOTE — H&P (View-Only) (Signed)
 GYNECOLOGIC ONCOLOGY NEW PATIENT CONSULTATION   Patient Name: [name]  Patient Age: 63 y.o. Date of Service: 03/27/24 Referring Provider: Dr. Jenny Ozan  Primary Care Provider: Kathyleen Parkins, MD Consulting Provider: Wiley Hanger, MD   Assessment/Plan:  Postmenopausal patient with clinical stage I low-grade endometrioid adenocarcinoma.  We reviewed the nature of endometrial cancer and its recommended surgical staging, including total hysterectomy, bilateral salpingo-oophorectomy, and lymph node assessment. The patient is a suitable candidate for staging via a minimally invasive approach to surgery.  We reviewed that robotic assistance would be used to complete the surgery.   We discussed that most endometrial cancer is detected early and that decisions regarding adjuvant therapy will be made based on her final pathology.   We reviewed the sentinel lymph node technique. Risks and benefits of sentinel lymph node biopsy was reviewed. We reviewed the technique and ICG dye. The patient DOES have a shellfish allergy, no known liver dysfunction. We reviewed the false negative rate (0.4%), and that 3% of patients with metastatic disease will not have it detected by SLN biopsy in endometrial cancer. A low risk of allergic reaction to the dye, <0.2% for ICG, has been reported. We also discussed that in the case of failed mapping, which occurs 40% of the time, a bilateral or unilateral lymphadenectomy will be performed at the surgeon's discretion.   Potential benefits of sentinel nodes including a higher detection rate for metastasis due to ultrastaging and potential reduction in operative morbidity. However, there remains uncertainty as to the role for treatment of micrometastatic disease. Further, the benefit of operative morbidity associated with the SLN technique in endometrial cancer is not yet completely known. In other patient populations (e.g. the cervical cancer population) there has  been observed reductions in morbidity with SLN biopsy compared to pelvic lymphadenectomy. Lymphedema, nerve dysfunction and lymphocysts are all potential risks with the SLN technique as with complete lymphadenectomy. Additional risks to the patient include the risk of damage to an internal organ while operating in an altered view (e.g. the black and white image of the robotic fluorescence imaging mode).   The patient was consented for a robotic assisted hysterectomy, bilateral salpingo-oophorectomy, sentinel lymph node evaluation, possible lymph node dissection, possible laparotomy. The risks of surgery were discussed in detail and she understands these to include infection; wound separation; hernia; vaginal cuff separation, injury to adjacent organs such as bowel, bladder, blood vessels, ureters and nerves; bleeding which may require blood transfusion; anesthesia risk; thromboembolic events; possible death; unforeseen complications; possible need for re-exploration; medical complications such as heart attack, stroke, pleural effusion and pneumonia; and, if full lymphadenectomy is performed the risk of lymphedema and lymphocyst. The patient will receive DVT and antibiotic prophylaxis as indicated. She voiced a clear understanding. She had the opportunity to ask questions. Perioperative instructions were reviewed with her. Prescriptions for post-op medications were sent to her pharmacy of choice.  Given family history of colon, breast, and ovarian cancer, discussed referral to genetics which was placed today.  A copy of this note was sent to the patient's referring provider.   75 minutes of total time was spent for this patient encounter, including preparation, face-to-face counseling with the patient and coordination of care, and documentation of the encounter.  Wiley Hanger, MD  Division of Gynecologic Oncology  Department of Obstetrics and Gynecology  [facility]   ___________________________________________  Chief Complaint: Chief Complaint  Patient presents with   Endometrial cancer [facility])    History of  Present Illness:  [name] is a 63 y.o. y.o. female who is seen in consultation at the request of Dr. Ozan for an evaluation of endometrial cancer.  03/11/2024: Patient seen for postmenopausal bleeding.  Initially had 3 days of pink spotting and cramping in April.  Another several episodes in early May. 03/11/24: Endometrial biopsy reveals grade 1 endometrioid adenocarcinoma. 03/22/24: Uterus measures 7.2 x 2.2 x 4.2 cm.  Endometrium 19 mm.  Ovaries normal   Today, the patient comes in with her husband.  She notes overall doing well but somewhat anxious.  Began having very light spotting that she noticed just when she wiped after urinating that started in early April with mild associated cramping.  This would happen every 2 to 5 days.  She denies any heavier bleeding until after her biopsy.  At that time, she had 3 or 4 days of some bleeding which has now subsided.  Dors is a good appetite without nausea or emesis.  Has some baseline stress urinary incontinence.  Denies any recent changes to bowel function.  Denies any recent weight changes.  PAST MEDICAL HISTORY:  Past Medical History:  Diagnosis Date   Anal fissure    Carpal tunnel syndrome on both sides    Hyperlipidemia    Hypertension      PAST SURGICAL HISTORY:  Past Surgical History:  Procedure Laterality Date   BREAST BIOPSY Left 06/23/2012   fibroadenomatoid change with calcifications, fibrocystic change with usual ductal hyperplasia, and pseudoangiomatous stromal hyperplasia (PASH)   CARPAL TUNNEL RELEASE Bilateral MAY/JUL 2004   COLONOSCOPY  05/20/2007   [id number] colon   COLONOSCOPY N/A 09/13/2017   Procedure: COLONOSCOPY;  Surgeon: Alyce Jubilee, MD;  [facility];  Service: Endoscopy;  Laterality: N/A;  10:30 AM   COLONOSCOPY WITH PROPOFOL  N/A 11/27/2022    Procedure: COLONOSCOPY WITH PROPOFOL ;  Surgeon: Vinetta Greening, DO;  [facility];  Service: Endoscopy;  Laterality: N/A;  9:30 am   POLYPECTOMY  11/27/2022   Procedure: POLYPECTOMY;  Surgeon: Vinetta Greening, DO;  [facility];  Service: Endoscopy;;    OB/GYN HISTORY:  OB History  Gravida Para Term Preterm AB Living  0 0 0 0 0 0  SAB IAB Ectopic Multiple Live Births  0 0 0 0 0    No LMP recorded. Patient is postmenopausal.  Age at menarche: 67  Age at menopause: 89 Hx of HRT: denies Hx of STDs: denies Last pap: 2024 History of abnormal pap smears: denies  SCREENING STUDIES:  Last mammogram: 2024  Last colonoscopy: 2024  MEDICATIONS: Outpatient Encounter Medications as of 03/27/2024  Medication Sig   acetaminophen (TYLENOL) 500 MG tablet Take 500 mg by mouth every 6 (six) hours as needed for moderate pain.   ALPRAZolam (XANAX) 0.5 MG tablet Take 0.5 mg 2 (two) times daily as needed by mouth (for anxiety).    ezetimibe (ZETIA) 10 MG tablet Take 5 mg by mouth at bedtime.   fluticasone (FLONASE) 50 MCG/ACT nasal spray Place 1 spray into both nostrils daily as needed for allergies or rhinitis.   lactase (LACTAID) 3000 units tablet Take 3,000 Units by mouth daily as needed (when eating ice cream).   lisinopril (PRINIVIL,ZESTRIL) 10 MG tablet Take 10 mg by mouth daily.    simvastatin (ZOCOR) 40 MG tablet Take 40 mg by mouth at bedtime.   aspirin EC 81 MG tablet Take 81 mg every other day by mouth. At bedtime. (Patient not taking:  Reported on 03/26/2024)   No facility-administered encounter medications on file as of 03/27/2024.    ALLERGIES:  Allergies  Allergen Reactions   Codeine Itching   Lactose Intolerance (Gi)     gas   Moxifloxacin     SEVERE DIARRHEA/ABDOMINAL PAIN   Peanut-Containing Drug Products     Felt choked up   Shellfish Allergy Other (See Comments)    Lips broke out     FAMILY HISTORY:  Family History  Problem Relation Age of  Onset   Breast cancer Mother    Kidney failure Father    Breast cancer Sister    Melanoma Sister    Colon cancer Maternal Grandmother    Ovarian cancer Paternal Grandmother    Cancer Paternal Grandmother        ovarian   Colon polyps Neg Hx    Pancreatic cancer Neg Hx    Prostate cancer Neg Hx      SOCIAL HISTORY:  Social Connections: Moderately Integrated (03/11/2024)   Social Connection and Isolation Panel [NHANES]    Frequency of Communication with Friends and Family: Three times a week    Frequency of Social Gatherings with Friends and Family: Once a week    Attends Religious Services: 1 to 4 times per year    Active Member of [employer] or Organizations: No    Attends Engineer, structural: Never    Marital Status: Married    REVIEW OF SYSTEMS:  + bleeding, anxiety, depression Denies appetite changes, fevers, chills, fatigue, unexplained weight changes. Denies hearing loss, neck lumps or masses, mouth sores, ringing in ears or voice changes. Denies cough or wheezing.  Denies shortness of breath. Denies chest pain or palpitations. Denies leg swelling. Denies abdominal distention, pain, blood in stools, constipation, diarrhea, nausea, vomiting, or early satiety. Denies pain with intercourse, dysuria, frequency, hematuria or incontinence. Denies hot flashes, pelvic pain or vaginal discharge.   Denies joint pain, back pain or muscle pain/cramps. Denies itching, rash, or wounds. Denies dizziness, headaches, numbness or seizures. Denies swollen lymph nodes or glands, denies easy bruising or bleeding. Denies confusion, or decreased concentration.  Physical Exam:  Vital Signs for this encounter:  Blood pressure 131/69, pulse 86, temperature 98 F (36.7 C), temperature source Oral, resp. rate 17, height 5\' 4"  (1.626 m), weight 221 lb (100.2 kg), SpO2 97%. Body mass index is 37.93 kg/m. General: Alert, oriented, no acute distress.  HEENT: Normocephalic, atraumatic. Sclera  anicteric.  Chest: Clear to auscultation bilaterally. No wheezes, rhonchi, or rales. Cardiovascular: Regular rate and rhythm, no murmurs, rubs, or gallops.  Abdomen: Obese. Normoactive bowel sounds. Soft, nondistended, nontender to palpation. No masses or hepatosplenomegaly appreciated. No palpable fluid wave.  Extremities: Grossly normal range of motion. Warm, well perfused. No edema bilaterally.  Skin: No rashes or lesions.  Lymphatics: No cervical, supraclavicular, or inguinal adenopathy.  GU:  Normal external female genitalia. No lesions. No discharge or bleeding.             Bladder/urethra:  No lesions or masses, well supported bladder             Vagina: Well-rugated, no lesions.             Cervix: Normal appearing, no lesions.             Uterus:  Small, mobile, no parametrial involvement or nodularity.             Adnexa: No masses.  Rectal: Deferred.  LABORATORY AND RADIOLOGIC DATA:  Outside medical records were reviewed to synthesize the above history, along with the history and physical obtained during the visit.   No results found for: "WBC", "HGB", "HCT", "PLT", "GLUCOSE", "CHOL", "TRIG", "HDL", "LDLDIRECT", "LDLCALC", "ALT", "AST", "NA", "K", "CL", "CREATININE", "BUN", "CO2", "TSH", "PSA", "INR", "GLUF", "HGBA1C", "MICROALBUR"

## 2024-03-26 NOTE — Progress Notes (Unsigned)
 GYNECOLOGIC ONCOLOGY NEW PATIENT CONSULTATION   Patient Name: [name]  Patient Age: 63 y.o. Date of Service: 03/27/24 Referring Provider: Dr. Jenny Ozan  Primary Care Provider: Kathyleen Parkins, MD Consulting Provider: Wiley Hanger, MD   Assessment/Plan:  Postmenopausal patient with clinical stage I low-grade endometrioid adenocarcinoma.  We reviewed the nature of endometrial cancer and its recommended surgical staging, including total hysterectomy, bilateral salpingo-oophorectomy, and lymph node assessment. The patient is a suitable candidate for staging via a minimally invasive approach to surgery.  We reviewed that robotic assistance would be used to complete the surgery.   We discussed that most endometrial cancer is detected early and that decisions regarding adjuvant therapy will be made based on her final pathology.   We reviewed the sentinel lymph node technique. Risks and benefits of sentinel lymph node biopsy was reviewed. We reviewed the technique and ICG dye. The patient DOES have a shellfish allergy, no known liver dysfunction. We reviewed the false negative rate (0.4%), and that 3% of patients with metastatic disease will not have it detected by SLN biopsy in endometrial cancer. A low risk of allergic reaction to the dye, <0.2% for ICG, has been reported. We also discussed that in the case of failed mapping, which occurs 40% of the time, a bilateral or unilateral lymphadenectomy will be performed at the surgeon's discretion.   Potential benefits of sentinel nodes including a higher detection rate for metastasis due to ultrastaging and potential reduction in operative morbidity. However, there remains uncertainty as to the role for treatment of micrometastatic disease. Further, the benefit of operative morbidity associated with the SLN technique in endometrial cancer is not yet completely known. In other patient populations (e.g. the cervical cancer population) there has  been observed reductions in morbidity with SLN biopsy compared to pelvic lymphadenectomy. Lymphedema, nerve dysfunction and lymphocysts are all potential risks with the SLN technique as with complete lymphadenectomy. Additional risks to the patient include the risk of damage to an internal organ while operating in an altered view (e.g. the black and white image of the robotic fluorescence imaging mode).   The patient was consented for a robotic assisted hysterectomy, bilateral salpingo-oophorectomy, sentinel lymph node evaluation, possible lymph node dissection, possible laparotomy. The risks of surgery were discussed in detail and she understands these to include infection; wound separation; hernia; vaginal cuff separation, injury to adjacent organs such as bowel, bladder, blood vessels, ureters and nerves; bleeding which may require blood transfusion; anesthesia risk; thromboembolic events; possible death; unforeseen complications; possible need for re-exploration; medical complications such as heart attack, stroke, pleural effusion and pneumonia; and, if full lymphadenectomy is performed the risk of lymphedema and lymphocyst. The patient will receive DVT and antibiotic prophylaxis as indicated. She voiced a clear understanding. She had the opportunity to ask questions. Perioperative instructions were reviewed with her. Prescriptions for post-op medications were sent to her pharmacy of choice.  Given family history of colon, breast, and ovarian cancer, discussed referral to genetics which was placed today.  A copy of this note was sent to the patient's referring provider.   75 minutes of total time was spent for this patient encounter, including preparation, face-to-face counseling with the patient and coordination of care, and documentation of the encounter.  Wiley Hanger, MD  Division of Gynecologic Oncology  Department of Obstetrics and Gynecology  [facility]   ___________________________________________  Chief Complaint: Chief Complaint  Patient presents with   Endometrial cancer [facility])    History of  Present Illness:  [name] is a 63 y.o. y.o. female who is seen in consultation at the request of Dr. Ozan for an evaluation of endometrial cancer.  03/11/2024: Patient seen for postmenopausal bleeding.  Initially had 3 days of pink spotting and cramping in April.  Another several episodes in early May. 03/11/24: Endometrial biopsy reveals grade 1 endometrioid adenocarcinoma. 03/22/24: Uterus measures 7.2 x 2.2 x 4.2 cm.  Endometrium 19 mm.  Ovaries normal   Today, the patient comes in with her husband.  She notes overall doing well but somewhat anxious.  Began having very light spotting that she noticed just when she wiped after urinating that started in early April with mild associated cramping.  This would happen every 2 to 5 days.  She denies any heavier bleeding until after her biopsy.  At that time, she had 3 or 4 days of some bleeding which has now subsided.  Dors is a good appetite without nausea or emesis.  Has some baseline stress urinary incontinence.  Denies any recent changes to bowel function.  Denies any recent weight changes.  PAST MEDICAL HISTORY:  Past Medical History:  Diagnosis Date   Anal fissure    Carpal tunnel syndrome on both sides    Hyperlipidemia    Hypertension      PAST SURGICAL HISTORY:  Past Surgical History:  Procedure Laterality Date   BREAST BIOPSY Left 06/23/2012   fibroadenomatoid change with calcifications, fibrocystic change with usual ductal hyperplasia, and pseudoangiomatous stromal hyperplasia (PASH)   CARPAL TUNNEL RELEASE Bilateral MAY/JUL 2004   COLONOSCOPY  05/20/2007   [id number] colon   COLONOSCOPY N/A 09/13/2017   Procedure: COLONOSCOPY;  Surgeon: Alyce Jubilee, MD;  [facility];  Service: Endoscopy;  Laterality: N/A;  10:30 AM   COLONOSCOPY WITH PROPOFOL  N/A 11/27/2022    Procedure: COLONOSCOPY WITH PROPOFOL ;  Surgeon: Vinetta Greening, DO;  [facility];  Service: Endoscopy;  Laterality: N/A;  9:30 am   POLYPECTOMY  11/27/2022   Procedure: POLYPECTOMY;  Surgeon: Vinetta Greening, DO;  [facility];  Service: Endoscopy;;    OB/GYN HISTORY:  OB History  Gravida Para Term Preterm AB Living  0 0 0 0 0 0  SAB IAB Ectopic Multiple Live Births  0 0 0 0 0    No LMP recorded. Patient is postmenopausal.  Age at menarche: 30  Age at menopause: 72 Hx of HRT: denies Hx of STDs: denies Last pap: 2024 History of abnormal pap smears: denies  SCREENING STUDIES:  Last mammogram: 2024  Last colonoscopy: 2024  MEDICATIONS: Outpatient Encounter Medications as of 03/27/2024  Medication Sig   acetaminophen (TYLENOL) 500 MG tablet Take 500 mg by mouth every 6 (six) hours as needed for moderate pain.   ALPRAZolam (XANAX) 0.5 MG tablet Take 0.5 mg 2 (two) times daily as needed by mouth (for anxiety).    ezetimibe (ZETIA) 10 MG tablet Take 5 mg by mouth at bedtime.   fluticasone (FLONASE) 50 MCG/ACT nasal spray Place 1 spray into both nostrils daily as needed for allergies or rhinitis.   lactase (LACTAID) 3000 units tablet Take 3,000 Units by mouth daily as needed (when eating ice cream).   lisinopril (PRINIVIL,ZESTRIL) 10 MG tablet Take 10 mg by mouth daily.    simvastatin (ZOCOR) 40 MG tablet Take 40 mg by mouth at bedtime.   aspirin EC 81 MG tablet Take 81 mg every other day by mouth. At bedtime. (Patient not taking:  Reported on 03/26/2024)   No facility-administered encounter medications on file as of 03/27/2024.    ALLERGIES:  Allergies  Allergen Reactions   Codeine Itching   Lactose Intolerance (Gi)     gas   Moxifloxacin     SEVERE DIARRHEA/ABDOMINAL PAIN   Peanut-Containing Drug Products     Felt choked up   Shellfish Allergy Other (See Comments)    Lips broke out     FAMILY HISTORY:  Family History  Problem Relation Age of  Onset   Breast cancer Mother    Kidney failure Father    Breast cancer Sister    Melanoma Sister    Colon cancer Maternal Grandmother    Ovarian cancer Paternal Grandmother    Cancer Paternal Grandmother        ovarian   Colon polyps Neg Hx    Pancreatic cancer Neg Hx    Prostate cancer Neg Hx      SOCIAL HISTORY:  Social Connections: Moderately Integrated (03/11/2024)   Social Connection and Isolation Panel [NHANES]    Frequency of Communication with Friends and Family: Three times a week    Frequency of Social Gatherings with Friends and Family: Once a week    Attends Religious Services: 1 to 4 times per year    Active Member of [employer] or Organizations: No    Attends Engineer, structural: Never    Marital Status: Married    REVIEW OF SYSTEMS:  + bleeding, anxiety, depression Denies appetite changes, fevers, chills, fatigue, unexplained weight changes. Denies hearing loss, neck lumps or masses, mouth sores, ringing in ears or voice changes. Denies cough or wheezing.  Denies shortness of breath. Denies chest pain or palpitations. Denies leg swelling. Denies abdominal distention, pain, blood in stools, constipation, diarrhea, nausea, vomiting, or early satiety. Denies pain with intercourse, dysuria, frequency, hematuria or incontinence. Denies hot flashes, pelvic pain or vaginal discharge.   Denies joint pain, back pain or muscle pain/cramps. Denies itching, rash, or wounds. Denies dizziness, headaches, numbness or seizures. Denies swollen lymph nodes or glands, denies easy bruising or bleeding. Denies confusion, or decreased concentration.  Physical Exam:  Vital Signs for this encounter:  Blood pressure 131/69, pulse 86, temperature 98 F (36.7 C), temperature source Oral, resp. rate 17, height 5\' 4"  (1.626 m), weight 221 lb (100.2 kg), SpO2 97%. Body mass index is 37.93 kg/m. General: Alert, oriented, no acute distress.  HEENT: Normocephalic, atraumatic. Sclera  anicteric.  Chest: Clear to auscultation bilaterally. No wheezes, rhonchi, or rales. Cardiovascular: Regular rate and rhythm, no murmurs, rubs, or gallops.  Abdomen: Obese. Normoactive bowel sounds. Soft, nondistended, nontender to palpation. No masses or hepatosplenomegaly appreciated. No palpable fluid wave.  Extremities: Grossly normal range of motion. Warm, well perfused. No edema bilaterally.  Skin: No rashes or lesions.  Lymphatics: No cervical, supraclavicular, or inguinal adenopathy.  GU:  Normal external female genitalia. No lesions. No discharge or bleeding.             Bladder/urethra:  No lesions or masses, well supported bladder             Vagina: Well-rugated, no lesions.             Cervix: Normal appearing, no lesions.             Uterus:  Small, mobile, no parametrial involvement or nodularity.             Adnexa: No masses.  Rectal: Deferred.  LABORATORY AND RADIOLOGIC DATA:  Outside medical records were reviewed to synthesize the above history, along with the history and physical obtained during the visit.   No results found for: "WBC", "HGB", "HCT", "PLT", "GLUCOSE", "CHOL", "TRIG", "HDL", "LDLDIRECT", "LDLCALC", "ALT", "AST", "NA", "K", "CL", "CREATININE", "BUN", "CO2", "TSH", "PSA", "INR", "GLUF", "HGBA1C", "MICROALBUR"

## 2024-03-27 ENCOUNTER — Inpatient Hospital Stay: Admitting: Gynecologic Oncology

## 2024-03-27 ENCOUNTER — Encounter: Payer: Self-pay | Admitting: Gynecologic Oncology

## 2024-03-27 ENCOUNTER — Inpatient Hospital Stay: Attending: Gynecologic Oncology | Admitting: Gynecologic Oncology

## 2024-03-27 VITALS — BP 131/69 | HR 86 | Temp 98.0°F | Resp 17 | Ht 64.0 in | Wt 221.0 lb

## 2024-03-27 DIAGNOSIS — I10 Essential (primary) hypertension: Secondary | ICD-10-CM | POA: Diagnosis not present

## 2024-03-27 DIAGNOSIS — E785 Hyperlipidemia, unspecified: Secondary | ICD-10-CM | POA: Diagnosis not present

## 2024-03-27 DIAGNOSIS — Z8041 Family history of malignant neoplasm of ovary: Secondary | ICD-10-CM | POA: Diagnosis not present

## 2024-03-27 DIAGNOSIS — Z79899 Other long term (current) drug therapy: Secondary | ICD-10-CM | POA: Diagnosis not present

## 2024-03-27 DIAGNOSIS — N950 Postmenopausal bleeding: Secondary | ICD-10-CM | POA: Diagnosis not present

## 2024-03-27 DIAGNOSIS — Z808 Family history of malignant neoplasm of other organs or systems: Secondary | ICD-10-CM | POA: Diagnosis not present

## 2024-03-27 DIAGNOSIS — C541 Malignant neoplasm of endometrium: Secondary | ICD-10-CM | POA: Insufficient documentation

## 2024-03-27 DIAGNOSIS — Z7982 Long term (current) use of aspirin: Secondary | ICD-10-CM | POA: Insufficient documentation

## 2024-03-27 DIAGNOSIS — N393 Stress incontinence (female) (male): Secondary | ICD-10-CM | POA: Insufficient documentation

## 2024-03-27 DIAGNOSIS — Z6837 Body mass index (BMI) 37.0-37.9, adult: Secondary | ICD-10-CM | POA: Insufficient documentation

## 2024-03-27 DIAGNOSIS — E669 Obesity, unspecified: Secondary | ICD-10-CM | POA: Diagnosis not present

## 2024-03-27 DIAGNOSIS — Z803 Family history of malignant neoplasm of breast: Secondary | ICD-10-CM | POA: Insufficient documentation

## 2024-03-27 DIAGNOSIS — Z800 Family history of malignant neoplasm of digestive organs: Secondary | ICD-10-CM | POA: Diagnosis not present

## 2024-03-27 MED ORDER — TRAMADOL HCL 50 MG PO TABS: 50.0000 mg | ORAL_TABLET | Freq: Four times a day (QID) | ORAL | 0 refills | Status: AC | PRN

## 2024-03-27 MED ORDER — SENNOSIDES-DOCUSATE SODIUM 8.6-50 MG PO TABS: 2.0000 | ORAL_TABLET | Freq: Every day | ORAL | 0 refills | Status: AC

## 2024-03-27 NOTE — Progress Notes (Signed)
 Patient here for a consult with Dr. Orvil Bland and for a pre-operative appointment prior to her scheduled surgery on 04/02/2024. She is scheduled for a robotic assisted total laparoscopic hysterectomy, bilateral salpingo-oophorectomy, sentinel lymph node biopsy, possible lymph node dissection, possible laparotomy. The surgery was discussed in detail.  See after visit summary for additional details.    Discussed post-op pain management in detail including the aspects of the enhanced recovery pathway.  Advised her that a new prescription would be sent in for Tramadol and it is only to be used for after her upcoming surgery.  We discussed the use of tylenol post-op and to monitor for a maximum of 4,000 mg in a 24 hour period.  Also prescribed sennakot to be used after surgery and to hold if having loose stools.  Discussed bowel regimen in detail.     Discussed the use of SCDs and measures to take at home to prevent DVT including frequent mobility.  Reportable signs and symptoms of DVT discussed. Post-operative instructions discussed and expectations for after surgery. Incisional care discussed as well including reportable signs and symptoms including erythema, drainage, wound separation.     30 minutes spent with the patient.  Verbalizing understanding of material discussed. No needs or concerns voiced at the end of the visit.   Advised patient to call for any needs.  Advised that her post-operative medications had been prescribed and could be picked up at any time.    This appointment is included in the global surgical bundle as pre-operative teaching and has no charge.

## 2024-03-27 NOTE — Patient Instructions (Addendum)
 Preparing for your Surgery  Plan for surgery on April 02, 2024 with Dr. Wiley Hanger at [facility]. You will be scheduled for robotic assisted total laparoscopic hysterectomy (removal of the uterus and cervix), bilateral salpingo-oophorectomy (removal of both ovaries and fallopian tubes), sentinel lymph node biopsy, possible lymph node dissection, possible laparotomy (larger incision on your abdomen if needed).  Pre-operative Testing -You will receive a phone call from presurgical testing at [facility] to arrange for a pre-operative appointment and lab work.  -Bring your insurance card, copy of an advanced directive if applicable, medication list  -At that visit, you will be asked to sign a consent for a possible blood transfusion in case a transfusion becomes necessary during surgery.  The need for a blood transfusion is rare but having consent is a necessary part of your care.     -You can continue taking your baby aspirin with your last dose being the day before surgery in the am. DO NOT TAKE THE MORNING OF SURGERY.  -Do not take supplements such as fish oil (omega 3), red yeast rice, turmeric before your surgery. STOP TAKING AT LEAST 10 DAYS BEFORE SURGERY. You want to avoid medications with aspirin in them including headache powders such as BC or Goody's), Excedrin migraine.  Day Before Surgery at Home -You will be asked to take in a light diet the day before surgery. You will be advised you can have clear liquids up until 3 hours before your surgery.    Eat a light diet the day before surgery.  Examples including soups, broths, toast, yogurt, mashed potatoes.  AVOID GAS PRODUCING FOODS AND BEVERAGES. Things to avoid include carbonated beverages (fizzy beverages, sodas), raw fruits and raw vegetables (uncooked), or beans.   If your bowels are filled with gas, your surgeon will have difficulty visualizing your pelvic organs which increases your surgical risks.  Your  role in recovery Your role is to become active as soon as directed by your doctor, while still giving yourself time to heal.  Rest when you feel tired. You will be asked to do the following in order to speed your recovery:  - Cough and breathe deeply. This helps to clear and expand your lungs and can prevent pneumonia after surgery.  - STAY ACTIVE WHEN YOU GET HOME. Do mild physical activity. Walking or moving your legs help your circulation and body functions return to normal. Do not try to get up or walk alone the first time after surgery.   -If you develop swelling on one leg or the other, pain in the back of your leg, redness/warmth in one of your legs, please call the office or go to the Emergency Room to have a doppler to rule out a blood clot. For shortness of breath, chest pain-seek care in the Emergency Room as soon as possible. - Actively manage your pain. Managing your pain lets you move in comfort. We will ask you to rate your pain on a scale of zero to 10. It is your responsibility to tell your doctor or nurse where and how much you hurt so your pain can be treated.  Special Considerations -If you are diabetic, you may be placed on insulin after surgery to have closer control over your blood sugars to promote healing and recovery.  This does not mean that you will be discharged on insulin.  If applicable, your oral antidiabetics will be resumed when you are tolerating a solid diet.  -Your final pathology results  from surgery should be available around one week after surgery and the results will be relayed to you when available.  -FMLA forms can be faxed to [phone] and please allow 5-7 business days for completion.  Pain Management After Surgery -You will be prescribed your pain medication and bowel regimen medications before surgery so that you can have these available when you are discharged from the hospital. The pain medication is for use ONLY AFTER surgery and a new prescription  will not be given.   -Make sure that you have Tylenol and Ibuprofen IF YOU ARE ABLE TO TAKE THESE MEDICATIONS at home to use on a regular basis after surgery for pain control. We recommend alternating the medications every hour to six hours since they work differently and are processed in the body differently for pain relief.  -Review the attached handout on narcotic use and their risks and side effects.   Bowel Regimen -You will be prescribed Sennakot-S to take nightly to prevent constipation especially if you are taking the narcotic pain medication intermittently.  It is important to prevent constipation and drink adequate amounts of liquids. You can stop taking this medication when you are not taking pain medication and you are back on your normal bowel routine.  Risks of Surgery Risks of surgery are low but include bleeding, infection, damage to surrounding structures, re-operation, blood clots, and very rarely death.   Blood Transfusion Information (For the consent to be signed before surgery)  We will be checking your blood type before surgery so in case of emergencies, we will know what type of blood you would need.                                            WHAT IS A BLOOD TRANSFUSION?  A transfusion is the replacement of blood or some of its parts. Blood is made up of multiple cells which provide different functions. Red blood cells carry oxygen and are used for blood loss replacement. White blood cells fight against infection. Platelets control bleeding. Plasma helps clot blood. Other blood products are available for specialized needs, such as hemophilia or other clotting disorders. BEFORE THE TRANSFUSION  Who gives blood for transfusions?  You may be able to donate blood to be used at a later date on yourself (autologous donation). Relatives can be asked to donate blood. This is generally not any safer than if you have received blood from a stranger. The same precautions are  taken to ensure safety when a relative's blood is donated. Healthy volunteers who are fully evaluated to make sure their blood is safe. This is blood bank blood. Transfusion therapy is the safest it has ever been in the practice of medicine. Before blood is taken from a donor, a complete history is taken to make sure that person has no history of diseases nor engages in risky social behavior (examples are intravenous drug use or sexual activity with multiple partners). The donor's travel history is screened to minimize risk of transmitting infections, such as malaria. The donated blood is tested for signs of infectious diseases, such as HIV and hepatitis. The blood is then tested to be sure it is compatible with you in order to minimize the chance of a transfusion reaction. If you or a relative donates blood, this is often done in anticipation of surgery and is not appropriate for emergency situations.  It takes many days to process the donated blood. RISKS AND COMPLICATIONS Although transfusion therapy is very safe and saves many lives, the main dangers of transfusion include:  Getting an infectious disease. Developing a transfusion reaction. This is an allergic reaction to something in the blood you were given. Every precaution is taken to prevent this. The decision to have a blood transfusion has been considered carefully by your caregiver before blood is given. Blood is not given unless the benefits outweigh the risks.  AFTER SURGERY INSTRUCTIONS  Return to work: 4-6 weeks if applicable  Activity: 1. Be up and out of the bed during the day.  Take a nap if needed.  You may walk up steps but be careful and use the hand rail.  Stair climbing will tire you more than you think, you may need to stop part way and rest.   2. No lifting or straining for 6 weeks over 10 pounds. No pushing, pulling, straining for 6 weeks.  3. No driving for 4-09 days when the following criteria have been met: Do not drive  if you are taking narcotic pain medicine and make sure that your reaction time has returned.   4. You can shower as soon as the next day after surgery. Shower daily.  Use your regular soap and water  (not directly on the incision) and pat your incision(s) dry afterwards; don't rub.  No tub baths or submerging your body in water  until cleared by your surgeon. If you have the soap that was given to you by pre-surgical testing that was used before surgery, you do not need to use it afterwards because this can irritate your incisions.   5. No sexual activity and nothing in the vagina for 12 weeks.  6. You may experience a small amount of clear drainage from your incisions, which is normal.  If the drainage persists, increases, or changes color please call the office.  7. Do not use creams, lotions, or ointments such as neosporin on your incisions after surgery until advised by your surgeon because they can cause removal of the dermabond glue on your incisions.    8. You may experience vaginal spotting after surgery or when the stitches at the top of the vagina begin to dissolve.  The spotting is normal but if you experience heavy bleeding, call our office.  9. Take Tylenol or ibuprofen first for pain if you are able to take these medications and only use narcotic pain medication for severe pain not relieved by the Tylenol or Ibuprofen.  Monitor your Tylenol intake to a max of 4,000 mg in a 24 hour period. You can alternate these medications after surgery.  Diet: 1. Low sodium Heart Healthy Diet is recommended but you are cleared to resume your normal (before surgery) diet after your procedure.  2. It is safe to use a laxative, such as Miralax or Colace, if you have difficulty moving your bowels before surgery. You have been prescribed Sennakot-S to take at bedtime every evening after surgery to keep bowel movements regular and to prevent constipation.    Wound Care: 1. Keep clean and dry.  Shower  daily.  Reasons to call the Doctor: Fever - Oral temperature greater than 100.4 degrees Fahrenheit Foul-smelling vaginal discharge Difficulty urinating Nausea and vomiting Increased pain at the site of the incision that is unrelieved with pain medicine. Difficulty breathing with or without chest pain New calf pain especially if only on one side Sudden, continuing increased vaginal bleeding with  or without clots.   Contacts: For questions or concerns you should contact:  Dr. Wiley Hanger at [phone]  Vira Grieves, NP at [phone]  After Hours: call [phone] and have the GYN Oncologist paged/contacted (after 5 pm or on the weekends). You will speak with an after hours RN and let he or she know you have had surgery.  Messages sent via mychart are for non-urgent matters and are not responded to after hours so for urgent needs, please call the after hours number.

## 2024-03-30 NOTE — Progress Notes (Addendum)
 For Anesthesia: PCP - Kathyleen Parkins, MD  Cardiologist - N/A  Bowel Prep reminder:N/A  Chest x-ray - N/A EKG - greater than 1 year Stress Test - N/A ECHO - N/A Cardiac Cath - N/A Pacemaker/ICD device last checked:N/A Pacemaker orders received: N/A Device Rep notified: N/A  Spinal Cord Stimulator:N/A  Sleep Study - N/A CPAP - N/A  Fasting Blood Sugar - N/A Checks Blood Sugar __N/A___ times a day Date and result of last Hgb A1c-N/A  Last dose of GLP1 agonist- N/A GLP1 instructions: N/A  Last dose of SGLT-2 inhibitors- N/A SGLT-2 instructions: N/A  Blood Thinner Instructions: N/A Aspirin Instructions: 81 mg Last Dose: Mar 17, 2024  Activity level: Able to exercise without chest pain and/or shortness of breath  Anesthesia review: N/A  Patient denies shortness of breath, fever, cough and chest pain at PAT appointment   Patient verbalized understanding of instructions that were given to them at the PAT appointment. Patient was also instructed that they will need to review over the PAT instructions again at home before surgery.

## 2024-03-31 ENCOUNTER — Other Ambulatory Visit: Payer: Self-pay

## 2024-03-31 ENCOUNTER — Encounter: Admitting: Genetic Counselor

## 2024-03-31 ENCOUNTER — Other Ambulatory Visit

## 2024-03-31 ENCOUNTER — Encounter (HOSPITAL_COMMUNITY): Payer: Self-pay | Admitting: Gynecologic Oncology

## 2024-03-31 ENCOUNTER — Inpatient Hospital Stay: Attending: Licensed Clinical Social Worker | Admitting: Licensed Clinical Social Worker

## 2024-03-31 NOTE — Progress Notes (Addendum)
 Attempted to obtain medical history via telephone, unable to reach at this time. HIPAA compliant voicemail message left requesting return call to pre surgical testing department.

## 2024-03-31 NOTE — Patient Instructions (Addendum)
 SURGICAL WAITING ROOM VISITATION  Patients having surgery or a procedure may have no more than 2 support people in the waiting area - these visitors may rotate.    Children under the age of 49 must have an adult with them who is not the patient.  Due to an increase in RSV and influenza rates and associated hospitalizations, children ages 62 and under may not visit patients in [facility] hospitals.  Visitors with respiratory illnesses are discouraged from visiting and should remain at home.  If the patient needs to stay at the hospital during part of their recovery, the visitor guidelines for inpatient rooms apply. Pre-op nurse will coordinate an appropriate time for 1 support person to accompany patient in pre-op.  This support person may not rotate.    Please refer to the [facility] website for the visitor guidelines for Inpatients (after your surgery is over and you are in a regular room).       Your procedure is scheduled on: Thursday, April 02, 2024   Report to [facility] Main Entrance    Report to admitting at 8:45 AM   Call this number if you have problems the morning of surgery [phone]    Light diet only today 04/01/2024 (avoid gas producing foods and drinks like carbonated beverages).   After Midnight you may have the following liquids until 8:00 AM DAY OF SURGERY  Water  Non-Citrus Juices (without pulp, NO RED-Apple, White grape, White cranberry) Black Coffee (NO MILK/CREAM OR CREAMERS, sugar ok)  Clear Tea (NO MILK/CREAM OR CREAMERS, sugar ok) regular and decaf                             Plain Jell-O (NO RED)                                           Fruit ices (not with fruit pulp, NO RED)                                     Popsicles (NO RED)                                                               Sports drinks like Gatorade (NO RED)                      If you have questions, please contact your surgeon's office.   FOLLOW BOWEL PREP AND ANY  ADDITIONAL PRE OP INSTRUCTIONS YOU RECEIVED FROM YOUR SURGEON'S OFFICE!!!     Oral Hygiene is also important to reduce your risk of infection.                                    Remember - BRUSH YOUR TEETH THE MORNING OF SURGERY WITH YOUR REGULAR TOOTHPASTE  DENTURES WILL BE REMOVED PRIOR TO SURGERY PLEASE DO NOT APPLY "Poly grip" OR ADHESIVES!!!   Do NOT smoke after Midnight   Stop all vitamins  and herbal supplements 7 days before surgery.   Take these medicines the morning of surgery with A SIP OF WATER : Xanax if needed             You may not have any metal on your body including hair pins, jewelry, and body piercing             Do not wear make-up, lotions, powders, perfumes/cologne, or deodorant  Do not wear nail polish including gel and S&S, artificial/acrylic nails, or any other type of covering on natural nails including finger and toenails. If you have artificial nails, gel coating, etc. that needs to be removed by a nail salon please have this removed prior to surgery or surgery may need to be canceled/ delayed if the surgeon/ anesthesia feels like they are unable to be safely monitored.   Do not shave  48 hours prior to surgery.    Do not bring valuables to the hospital. [facility] IS NOT             RESPONSIBLE   FOR VALUABLES.   Contacts, glasses, dentures or bridgework may not be worn into surgery.   Bring small overnight bag day of surgery.   DO NOT BRING YOUR HOME MEDICATIONS TO THE HOSPITAL. PHARMACY WILL DISPENSE MEDICATIONS LISTED ON YOUR MEDICATION LIST TO YOU DURING YOUR ADMISSION IN THE HOSPITAL!    Patients discharged on the day of surgery will not be allowed to drive home.  Someone NEEDS to stay with you for the first 24 hours after anesthesia.   Special Instructions: Bring a copy of your healthcare power of attorney and living will documents the day of surgery if you haven't scanned them before.              Please read over the following fact sheets you  were given: IF YOU HAVE QUESTIONS ABOUT YOUR PRE-OP INSTRUCTIONS PLEASE CALL [phone]   If you received a COVID test during your pre-op visit  it is requested that you wear a mask when out in public, stay away from anyone that may not be feeling well and notify your surgeon if you develop symptoms. If you test positive for Covid or have been in contact with anyone that has tested positive in the last 10 days please notify you surgeon.    [place] - Preparing for Surgery Before surgery, you can play an important role.  Because skin is not sterile, your skin needs to be as free of germs as possible.  You can reduce the number of germs on your skin by washing with CHG (chlorahexidine gluconate) soap before surgery.  CHG is an antiseptic cleaner which kills germs and bonds with the skin to continue killing germs even after washing. Please DO NOT use if you have an allergy to CHG or antibacterial soaps.  If your skin becomes reddened/irritated stop using the CHG and inform your nurse when you arrive at Short Stay. Do not shave (including legs and underarms) for at least 48 hours prior to the first CHG shower.  You may shave your face/neck.  Please follow these instructions carefully:  1.  Shower with CHG Soap the night before surgery and the  morning of surgery.  2.  If you choose to wash your hair, wash your hair first as usual with your normal  shampoo.  3.  After you shampoo, rinse your hair and body thoroughly to remove the shampoo.  4.  Use CHG as you would any other liquid soap.  You can apply chg directly to the skin and wash.  Gently with a scrungie or clean washcloth.  5.  Apply the CHG Soap to your body ONLY FROM THE NECK DOWN.   Do   not use on face/ open                           Wound or open sores. Avoid contact with eyes, ears mouth and   genitals (private parts).                       Wash face,  Genitals (private parts) with your normal soap.              6.  Wash thoroughly, paying special attention to the area where your    surgery  will be performed.  7.  Thoroughly rinse your body with warm water  from the neck down.  8.  DO NOT shower/wash with your normal soap after using and rinsing off the CHG Soap.                9.  Pat yourself dry with a clean towel.            10.  Wear clean pajamas.            11.  Place clean sheets on your bed the night of your first shower and do not  sleep with pets. Day of Surgery : Do not apply any lotions/deodorants the morning of surgery.  Please wear clean clothes to the hospital/surgery center.  FAILURE TO FOLLOW THESE INSTRUCTIONS MAY RESULT IN THE CANCELLATION OF YOUR SURGERY  PATIENT SIGNATURE_________________________________  NURSE SIGNATURE__________________________________  ________________________________________________________________________   WHAT IS A BLOOD TRANSFUSION? Blood Transfusion Information  A transfusion is the replacement of blood or some of its parts. Blood is made up of multiple cells which provide different functions. Red blood cells carry oxygen and are used for blood loss replacement. White blood cells fight against infection. Platelets control bleeding. Plasma helps clot blood. Other blood products are available for specialized needs, such as hemophilia or other clotting disorders. BEFORE THE TRANSFUSION  Who gives blood for transfusions?  Healthy volunteers who are fully evaluated to make sure their blood is safe. This is blood bank blood. Transfusion therapy is the safest it has ever been in the practice of medicine. Before blood is taken from a donor, a complete history is taken to make sure that person has no history of diseases nor engages in risky social behavior (examples are intravenous drug use or sexual activity with multiple partners). The donor's travel history is screened to minimize risk of transmitting infections, such as malaria. The donated blood is  tested for signs of infectious diseases, such as HIV and hepatitis. The blood is then tested to be sure it is compatible with you in order to minimize the chance of a transfusion reaction. If you or a relative donates blood, this is often done in anticipation of surgery and is not appropriate for emergency situations. It takes many days to process the donated blood. RISKS AND COMPLICATIONS Although transfusion therapy is very safe and saves many lives, the main dangers of transfusion include:  Getting an infectious disease. Developing a transfusion reaction. This is an allergic reaction to something in the blood you were given. Every precaution is taken to prevent this. The decision  to have a blood transfusion has been considered carefully by your caregiver before blood is given. Blood is not given unless the benefits outweigh the risks. AFTER THE TRANSFUSION Right after receiving a blood transfusion, you will usually feel much better and more energetic. This is especially true if your red blood cells have gotten low (anemic). The transfusion raises the level of the red blood cells which carry oxygen, and this usually causes an energy increase. The nurse administering the transfusion will monitor you carefully for complications. HOME CARE INSTRUCTIONS  No special instructions are needed after a transfusion. You may find your energy is better. Speak with your caregiver about any limitations on activity for underlying diseases you may have. SEEK MEDICAL CARE IF:  Your condition is not improving after your transfusion. You develop redness or irritation at the intravenous (IV) site. SEEK IMMEDIATE MEDICAL CARE IF:  Any of the following symptoms occur over the next 12 hours: Shaking chills. You have a temperature by mouth above 102 F (38.9 C), not controlled by medicine. Chest, back, or muscle pain. People around you feel you are not acting correctly or are confused. Shortness of breath or  difficulty breathing. Dizziness and fainting. You get a rash or develop hives. You have a decrease in urine output. Your urine turns a dark color or changes to pink, red, or brown. Any of the following symptoms occur over the next 10 days: You have a temperature by mouth above 102 F (38.9 C), not controlled by medicine. Shortness of breath. Weakness after normal activity. The white part of the eye turns yellow (jaundice). You have a decrease in the amount of urine or are urinating less often. Your urine turns a dark color or changes to pink, red, or brown. Document Released: 10/12/2000 Document Revised: 01/07/2012 Document Reviewed: 05/31/2008 [facility] Patient Information 2014 [place], [place].  _______________________________________________________________________

## 2024-03-31 NOTE — Progress Notes (Signed)
 [facility] Clinical Social Work  Initial Assessment   [name] is a 63 y.o. year old female contacted by phone. Clinical Social Work was referred by new patient protocol for assessment of psychosocial needs.   SDOH (Social Determinants of Health) assessments performed: No- reviewed from 03/11/24   SDOH Screenings   Food Insecurity: No Food Insecurity (03/11/2024)  Housing: Low Risk  (03/11/2024)  Transportation Needs: No Transportation Needs (03/11/2024)  Utilities: Not At Risk (03/11/2024)  Alcohol Screen: Low Risk  (03/11/2024)  Depression (PHQ2-9): High Risk (03/11/2024)  Financial Resource Strain: Low Risk  (03/11/2024)  Physical Activity: Insufficiently Active (03/11/2024)  Social Connections: Moderately Integrated (03/11/2024)  Stress: Stress Concern Present (03/11/2024)  Tobacco Use: Medium Risk (03/31/2024)     Distress Screen completed: No     No data to display            Family/Social Information:  Housing Arrangement: patient lives with husband Family members/support persons in your life? Family Transportation concerns: no  Financial concerns: No Type of concern: None Food access concerns: no Services Currently in place:  [employer]  Coping/ Adjustment to diagnosis: Patient understands treatment plan and what happens next? yes, has surgery scheduled for Thursday. She is nervous as she has never been sick before (aside from a cold). She is aware it is low grade and early stage, and is hopeful to have all of the cancer removed during surgery Concerns about diagnosis and/or treatment: Afraid of cancer Patient reported stressors: Anxiety/ nervousness Hopes and/or priorities: hopes surgery removes all of the cancer Current coping skills/ strengths: Ability for insight , Communication skills , Motivation for treatment/growth , and Supportive family/friends     SUMMARY: Current SDOH Barriers:  No major SDOH barriers identified today. Patient is dealing with  adjustment to diagnosis  Clinical Social Work Clinical Goal(s):  Demonstrate a reduction in symptoms related to :anxiety and stress related to diagnosis and treatment   Interventions: Discussed common feeling and emotions when being diagnosed with cancer, and the importance of support during treatment Informed patient of the support team roles and support services at [facility] Provided CSW contact information and encouraged patient to call with any questions or concerns   Follow Up Plan: CSW will follow-up with patient by phone  1-2 weeks after surgery Patient verbalizes understanding of plan: Yes    Dera Vanaken E Inetha Maret, LCSW Clinical Social Worker [facility]

## 2024-04-01 ENCOUNTER — Ambulatory Visit: Payer: Self-pay | Admitting: *Deleted

## 2024-04-01 ENCOUNTER — Encounter (HOSPITAL_COMMUNITY)
Admission: RE | Admit: 2024-04-01 | Discharge: 2024-04-01 | Disposition: A | Discharge status: Home / Self Care | Source: Ambulatory Visit | Attending: Gynecologic Oncology | Admitting: Gynecologic Oncology

## 2024-04-01 ENCOUNTER — Other Ambulatory Visit: Payer: Self-pay

## 2024-04-01 DIAGNOSIS — R9431 Abnormal electrocardiogram [ECG] [EKG]: Secondary | ICD-10-CM | POA: Diagnosis not present

## 2024-04-01 DIAGNOSIS — I10 Essential (primary) hypertension: Secondary | ICD-10-CM | POA: Insufficient documentation

## 2024-04-01 DIAGNOSIS — C541 Malignant neoplasm of endometrium: Secondary | ICD-10-CM | POA: Insufficient documentation

## 2024-04-01 DIAGNOSIS — Z01812 Encounter for preprocedural laboratory examination: Secondary | ICD-10-CM | POA: Diagnosis not present

## 2024-04-01 LAB — CBC
HCT: 46.9 % — ABNORMAL HIGH (ref 36.0–46.0)
Hemoglobin: 15.2 g/dL — ABNORMAL HIGH (ref 12.0–15.0)
MCH: 30.2 pg (ref 26.0–34.0)
MCHC: 32.4 g/dL (ref 30.0–36.0)
MCV: 93.1 fL (ref 80.0–100.0)
Platelets: 360 10*3/uL (ref 150–400)
RBC: 5.04 MIL/uL (ref 3.87–5.11)
RDW: 14.3 % (ref 11.5–15.5)
WBC: 11.7 10*3/uL — ABNORMAL HIGH (ref 4.0–10.5)
nRBC: 0 % (ref 0.0–0.2)

## 2024-04-01 LAB — COMPREHENSIVE METABOLIC PANEL WITH GFR
ALT: 21 U/L (ref 0–44)
AST: 24 U/L (ref 15–41)
Albumin: 4.4 g/dL (ref 3.5–5.0)
Alkaline Phosphatase: 61 U/L (ref 38–126)
Anion gap: 7 (ref 5–15)
BUN: 15 mg/dL (ref 8–23)
CO2: 25 mmol/L (ref 22–32)
Calcium: 9.3 mg/dL (ref 8.9–10.3)
Chloride: 102 mmol/L (ref 98–111)
Creatinine, Ser: 0.98 mg/dL (ref 0.44–1.00)
GFR, Estimated: >60 mL/min (ref >60)
Glucose, Bld: 116 mg/dL — ABNORMAL HIGH (ref 70–99)
Potassium: 4.3 mmol/L (ref 3.5–5.1)
Sodium: 134 mmol/L — ABNORMAL LOW (ref 135–145)
Total Bilirubin: 1.1 mg/dL (ref 0.0–1.2)
Total Protein: 8 g/dL (ref 6.5–8.1)

## 2024-04-01 NOTE — Telephone Encounter (Signed)
 Telephone call to check on pre-operative status.  Patient compliant with pre-operative instructions.  Reinforced nothing to eat after midnight. Clear liquids until 0745. Patient to arrive at 0845. Pt was also given results of preadmission blood work. WBC count slightly elevated. Pt denies fever, chills, cough, UTI symptoms, no pain and states she feels fine. Pt's sodium is slightly low, advised patient to not restrict sodium in her diet. Pt reports she doesn't add salt, but does try and not eat high sodium foods.  No questions or concerns voiced.  Instructed to call for any needs.

## 2024-04-01 NOTE — Telephone Encounter (Signed)
-----   Message from Suellyn Emory sent at 04/01/2024  9:11 AM EDT ----- WBC count is slightly elevated. Please call and review questions pertaining to poss infection (fever, chills, dysuria, cough, pain etc) ----- Message ----- From: Interface, Lab In [facility] Sent: 04/01/2024   7:58 AM EDT To: Suellyn Emory, NP

## 2024-04-01 NOTE — Anesthesia Preprocedure Evaluation (Signed)
 Anesthesia Evaluation  Patient identified by MRN, date of birth, ID band Patient awake    Reviewed: Allergy & Precautions, H&P , NPO status , Patient's Chart, lab work & pertinent test results  Airway Mallampati: III  TM Distance: >3 FB Neck ROM: Full    Dental  (+) Dental Advisory Given, Teeth Intact   Pulmonary former smoker   Pulmonary exam normal breath sounds clear to auscultation       Cardiovascular hypertension, Pt. on medications Normal cardiovascular exam Rhythm:Regular Rate:Normal     Neuro/Psych  PSYCHIATRIC DISORDERS Anxiety      Neuromuscular disease    GI/Hepatic negative GI ROS, Neg liver ROS,,,  Endo/Other  negative endocrine ROS    Renal/GU negative Renal ROS     Musculoskeletal negative musculoskeletal ROS (+)    Abdominal  (+) + obese  Peds  Hematology negative hematology ROS (+) DOES NOT REFUSE BLOOD PRODUCTS  Anesthesia Other Findings   Reproductive/Obstetrics                             Anesthesia Physical Anesthesia Plan  ASA: 2  Anesthesia Plan: General   Post-op Pain Management: Tylenol PO (pre-op)* and Gabapentin PO (pre-op)*   Induction: Intravenous  PONV Risk Score and Plan: 4 or greater and Ondansetron, Dexamethasone, Treatment may vary due to age or medical condition and Midazolam   Airway Management Planned: Oral ETT  Additional Equipment:   Intra-op Plan:   Post-operative Plan: Extubation in OR  Informed Consent: I have reviewed the patients History and Physical, chart, labs and discussed the procedure including the risks, benefits and alternatives for the proposed anesthesia with the patient or authorized representative who has indicated his/her understanding and acceptance.     Dental advisory given  Plan Discussed with: CRNA  Anesthesia Plan Comments: (2 x PIV)       Anesthesia Quick Evaluation

## 2024-04-02 ENCOUNTER — Other Ambulatory Visit: Payer: Self-pay

## 2024-04-02 ENCOUNTER — Ambulatory Visit (HOSPITAL_BASED_OUTPATIENT_CLINIC_OR_DEPARTMENT_OTHER): Payer: Self-pay | Admitting: Anesthesiology

## 2024-04-02 ENCOUNTER — Ambulatory Visit (HOSPITAL_COMMUNITY)
Admission: RE | Admit: 2024-04-02 | Discharge: 2024-04-02 | Disposition: A | Discharge status: Home / Self Care | Attending: Gynecologic Oncology | Admitting: Gynecologic Oncology

## 2024-04-02 ENCOUNTER — Encounter (HOSPITAL_COMMUNITY): Payer: Self-pay | Admitting: Gynecologic Oncology

## 2024-04-02 ENCOUNTER — Ambulatory Visit (HOSPITAL_COMMUNITY): Payer: Self-pay | Admitting: Anesthesiology

## 2024-04-02 ENCOUNTER — Encounter (HOSPITAL_COMMUNITY)
Admission: RE | Disposition: A | Discharge status: Home / Self Care | Payer: Self-pay | Source: Home / Self Care | Attending: Gynecologic Oncology

## 2024-04-02 DIAGNOSIS — C541 Malignant neoplasm of endometrium: Secondary | ICD-10-CM | POA: Diagnosis not present

## 2024-04-02 DIAGNOSIS — Z6836 Body mass index (BMI) 36.0-36.9, adult: Secondary | ICD-10-CM | POA: Diagnosis not present

## 2024-04-02 DIAGNOSIS — F419 Anxiety disorder, unspecified: Secondary | ICD-10-CM | POA: Insufficient documentation

## 2024-04-02 DIAGNOSIS — N950 Postmenopausal bleeding: Secondary | ICD-10-CM | POA: Diagnosis not present

## 2024-04-02 DIAGNOSIS — Z87891 Personal history of nicotine dependence: Secondary | ICD-10-CM | POA: Insufficient documentation

## 2024-04-02 DIAGNOSIS — Z7982 Long term (current) use of aspirin: Secondary | ICD-10-CM | POA: Diagnosis not present

## 2024-04-02 DIAGNOSIS — N393 Stress incontinence (female) (male): Secondary | ICD-10-CM | POA: Insufficient documentation

## 2024-04-02 DIAGNOSIS — N840 Polyp of corpus uteri: Secondary | ICD-10-CM | POA: Insufficient documentation

## 2024-04-02 DIAGNOSIS — I10 Essential (primary) hypertension: Secondary | ICD-10-CM | POA: Diagnosis not present

## 2024-04-02 DIAGNOSIS — E669 Obesity, unspecified: Secondary | ICD-10-CM | POA: Insufficient documentation

## 2024-04-02 DIAGNOSIS — Z79899 Other long term (current) drug therapy: Secondary | ICD-10-CM | POA: Insufficient documentation

## 2024-04-02 HISTORY — DX: Malignant neoplasm of endometrium: C54.1

## 2024-04-02 HISTORY — PX: INJECTION, FOR SENTINEL LYMPH NODE IDENTIFICATION: SHX7598

## 2024-04-02 HISTORY — PX: LYMPH NODE BIOPSY: SHX201

## 2024-04-02 HISTORY — DX: Anxiety disorder, unspecified: F41.9

## 2024-04-02 HISTORY — PX: ROBOTIC ASSISTED TOTAL HYSTERECTOMY WITH BILATERAL SALPINGO OOPHERECTOMY: SHX6086

## 2024-04-02 HISTORY — DX: Other complications of anesthesia, initial encounter: T88.59XA

## 2024-04-02 LAB — TYPE AND SCREEN
ABO/RH(D): O POS
Antibody Screen: NEGATIVE

## 2024-04-02 LAB — ABO/RH: ABO/RH(D): O POS

## 2024-04-02 SURGERY — HYSTERECTOMY, TOTAL, ROBOT-ASSISTED, LAPAROSCOPIC, WITH BILATERAL SALPINGO-OOPHORECTOMY
Anesthesia: General

## 2024-04-02 MED ORDER — MIDAZOLAM HCL 2 MG/2ML IJ SOLN
INTRAMUSCULAR | Status: AC
  Filled 2024-04-02: qty 2

## 2024-04-02 MED ORDER — CEFAZOLIN SODIUM-DEXTROSE 2-4 GM/100ML-% IV SOLN
2.0000 g | INTRAVENOUS | Status: AC
  Administered 2024-04-02: 2 g via INTRAVENOUS
  Filled 2024-04-02: qty 100

## 2024-04-02 MED ORDER — HEPARIN SODIUM (PORCINE) 5000 UNIT/ML IJ SOLN
5000.0000 [IU] | INTRAMUSCULAR | Status: AC
  Administered 2024-04-02: 5000 [IU] via SUBCUTANEOUS
  Filled 2024-04-02: qty 1

## 2024-04-02 MED ORDER — GABAPENTIN 300 MG PO CAPS
300.0000 mg | ORAL_CAPSULE | Freq: Once | ORAL | Status: AC
  Administered 2024-04-02: 300 mg via ORAL
  Filled 2024-04-02: qty 1

## 2024-04-02 MED ORDER — ORAL CARE MOUTH RINSE: 15.0000 mL | Freq: Once | OROMUCOSAL | Status: AC

## 2024-04-02 MED ORDER — ACETAMINOPHEN 500 MG PO TABS: 1000.0000 mg | ORAL_TABLET | Freq: Once | ORAL | Status: DC

## 2024-04-02 MED ORDER — FENTANYL CITRATE (PF) 250 MCG/5ML IJ SOLN
INTRAMUSCULAR | Status: DC | PRN
  Administered 2024-04-02 (×2): 100 ug via INTRAVENOUS
  Administered 2024-04-02: 50 ug via INTRAVENOUS

## 2024-04-02 MED ORDER — LIDOCAINE 2% (20 MG/ML) 5 ML SYRINGE
INTRAMUSCULAR | Status: DC | PRN
  Administered 2024-04-02: 100 mg via INTRAVENOUS

## 2024-04-02 MED ORDER — MIDAZOLAM HCL 2 MG/2ML IJ SOLN
INTRAMUSCULAR | Status: DC | PRN
  Administered 2024-04-02: 2 mg via INTRAVENOUS

## 2024-04-02 MED ORDER — STERILE WATER FOR INJECTION IJ SOLN
INTRAMUSCULAR | Status: DC | PRN
  Administered 2024-04-02: 10 mL

## 2024-04-02 MED ORDER — BUPIVACAINE LIPOSOME 1.3 % IJ SUSP
INTRAMUSCULAR | Status: AC
  Filled 2024-04-02: qty 20

## 2024-04-02 MED ORDER — PHENYLEPHRINE HCL-NACL 20-0.9 MG/250ML-% IV SOLN
INTRAVENOUS | Status: DC | PRN
  Administered 2024-04-02: 20 ug/min via INTRAVENOUS

## 2024-04-02 MED ORDER — STERILE WATER FOR INJECTION IJ SOLN
INTRAMUSCULAR | Status: DC | PRN
  Administered 2024-04-02: 1 mL via INTRAMUSCULAR

## 2024-04-02 MED ORDER — SUGAMMADEX SODIUM 200 MG/2ML IV SOLN
INTRAVENOUS | Status: DC | PRN
  Administered 2024-04-02: 200 mg via INTRAVENOUS

## 2024-04-02 MED ORDER — MEPERIDINE HCL 50 MG/ML IJ SOLN: 6.2500 mg | INTRAMUSCULAR | Status: DC | PRN

## 2024-04-02 MED ORDER — METRONIDAZOLE 500 MG/100ML IV SOLN
500.0000 mg | INTRAVENOUS | Status: AC
  Administered 2024-04-02: 500 mg via INTRAVENOUS
  Filled 2024-04-02: qty 100

## 2024-04-02 MED ORDER — DEXAMETHASONE SODIUM PHOSPHATE 10 MG/ML IJ SOLN
4.0000 mg | INTRAMUSCULAR | Status: AC
  Administered 2024-04-02: 10 mg via INTRAVENOUS

## 2024-04-02 MED ORDER — HYDROMORPHONE HCL 1 MG/ML IJ SOLN
0.2500 mg | INTRAMUSCULAR | Status: DC | PRN
  Administered 2024-04-02 (×2): 0.5 mg via INTRAVENOUS

## 2024-04-02 MED ORDER — BUPIVACAINE HCL (PF) 0.25 % IJ SOLN
INTRAMUSCULAR | Status: AC
  Filled 2024-04-02: qty 30

## 2024-04-02 MED ORDER — PROPOFOL 10 MG/ML IV BOLUS
INTRAVENOUS | Status: AC
  Filled 2024-04-02: qty 20

## 2024-04-02 MED ORDER — PROPOFOL 10 MG/ML IV BOLUS
INTRAVENOUS | Status: DC | PRN
  Administered 2024-04-02: 170 mg via INTRAVENOUS

## 2024-04-02 MED ORDER — LACTATED RINGERS IV SOLN: INTRAVENOUS | Status: DC

## 2024-04-02 MED ORDER — LACTATED RINGERS IR SOLN
Status: DC | PRN
  Administered 2024-04-02: 1000 mL

## 2024-04-02 MED ORDER — STERILE WATER FOR IRRIGATION IR SOLN
Status: DC | PRN
  Administered 2024-04-02: 1000 mL

## 2024-04-02 MED ORDER — DROPERIDOL 2.5 MG/ML IJ SOLN: 0.6250 mg | Freq: Once | INTRAMUSCULAR | Status: DC | PRN

## 2024-04-02 MED ORDER — BUPIVACAINE HCL (PF) 0.25 % IJ SOLN
INTRAMUSCULAR | Status: DC | PRN
  Administered 2024-04-02: 30 mL

## 2024-04-02 MED ORDER — KETAMINE HCL 50 MG/5ML IJ SOSY
PREFILLED_SYRINGE | INTRAMUSCULAR | Status: AC
  Filled 2024-04-02: qty 5

## 2024-04-02 MED ORDER — ACETAMINOPHEN 500 MG PO TABS
1000.0000 mg | ORAL_TABLET | ORAL | Status: AC
  Administered 2024-04-02: 1000 mg via ORAL
  Filled 2024-04-02: qty 2

## 2024-04-02 MED ORDER — HYDROMORPHONE HCL 1 MG/ML IJ SOLN
INTRAMUSCULAR | Status: AC
  Filled 2024-04-02: qty 1

## 2024-04-02 MED ORDER — ROCURONIUM BROMIDE 10 MG/ML (PF) SYRINGE
PREFILLED_SYRINGE | INTRAVENOUS | Status: DC | PRN
  Administered 2024-04-02: 20 mg via INTRAVENOUS
  Administered 2024-04-02: 10 mg via INTRAVENOUS
  Administered 2024-04-02: 70 mg via INTRAVENOUS

## 2024-04-02 MED ORDER — ONDANSETRON HCL 4 MG/2ML IJ SOLN
INTRAMUSCULAR | Status: DC | PRN
  Administered 2024-04-02: 4 mg via INTRAVENOUS

## 2024-04-02 MED ORDER — CHLORHEXIDINE GLUCONATE 0.12 % MT SOLN
15.0000 mL | Freq: Once | OROMUCOSAL | Status: AC
  Administered 2024-04-02: 15 mL via OROMUCOSAL

## 2024-04-02 MED ORDER — PHENYLEPHRINE 80 MCG/ML (10ML) SYRINGE FOR IV PUSH (FOR BLOOD PRESSURE SUPPORT)
PREFILLED_SYRINGE | INTRAVENOUS | Status: DC | PRN
  Administered 2024-04-02: 160 ug via INTRAVENOUS
  Administered 2024-04-02: 80 ug via INTRAVENOUS

## 2024-04-02 MED ORDER — ALBUMIN HUMAN 5 % IV SOLN
INTRAVENOUS | Status: AC
  Filled 2024-04-02: qty 250

## 2024-04-02 MED ORDER — STERILE WATER FOR INJECTION IJ SOLN
INTRAMUSCULAR | Status: AC
  Filled 2024-04-02: qty 10

## 2024-04-02 MED ORDER — FENTANYL CITRATE (PF) 250 MCG/5ML IJ SOLN
INTRAMUSCULAR | Status: AC
  Filled 2024-04-02: qty 5

## 2024-04-02 SURGICAL SUPPLY — 68 items
APPLICATOR ARISTA FLEXITIP XL (MISCELLANEOUS) IMPLANT
APPLICATOR SURGIFLO ENDO (HEMOSTASIS) IMPLANT
BAG COUNTER SPONGE SURGICOUNT (BAG) IMPLANT
BAG LAPAROSCOPIC 12 15 PORT 16 (BASKET) IMPLANT
BLADE SURG SZ10 CARB STEEL (BLADE) IMPLANT
COVER BACK TABLE 60X90IN (DRAPES) ×2 IMPLANT
COVER TIP SHEARS 8 DVNC (MISCELLANEOUS) ×2 IMPLANT
DERMABOND ADVANCED .7 DNX12 (GAUZE/BANDAGES/DRESSINGS) ×2 IMPLANT
DRAPE ARM DVNC X/XI (DISPOSABLE) ×8 IMPLANT
DRAPE COLUMN DVNC XI (DISPOSABLE) ×2 IMPLANT
DRAPE SHEET LG 3/4 BI-LAMINATE (DRAPES) ×2 IMPLANT
DRAPE SURG IRRIG POUCH 19X23 (DRAPES) ×2 IMPLANT
DRIVER NDL MEGA SUTCUT DVNCXI (INSTRUMENTS) ×2 IMPLANT
DRIVER NDLE MEGA SUTCUT DVNCXI (INSTRUMENTS) ×1 IMPLANT
DRSG OPSITE POSTOP 4X6 (GAUZE/BANDAGES/DRESSINGS) IMPLANT
DRSG OPSITE POSTOP 4X8 (GAUZE/BANDAGES/DRESSINGS) IMPLANT
ELECT PENCIL ROCKER SW 15FT (MISCELLANEOUS) IMPLANT
ELECT REM PT RETURN 15FT ADLT (MISCELLANEOUS) ×2 IMPLANT
FORCEPS BPLR FENES DVNC XI (FORCEP) ×2 IMPLANT
FORCEPS PROGRASP DVNC XI (FORCEP) ×2 IMPLANT
GAUZE 4X4 16PLY ~~LOC~~+RFID DBL (SPONGE) ×4 IMPLANT
GLOVE BIO SURGEON STRL SZ 6 (GLOVE) ×8 IMPLANT
GLOVE BIO SURGEON STRL SZ 6.5 (GLOVE) ×2 IMPLANT
GOWN STRL REUS W/ TWL LRG LVL3 (GOWN DISPOSABLE) ×8 IMPLANT
GRASPER SUT TROCAR 14GX15 (MISCELLANEOUS) IMPLANT
HEMOSTAT ARISTA ABSORB 3G PWDR (HEMOSTASIS) IMPLANT
HOLDER FOLEY CATH W/STRAP (MISCELLANEOUS) IMPLANT
IRRIGATION SUCT STRKRFLW 2 WTP (MISCELLANEOUS) ×2 IMPLANT
KIT PROCEDURE DVNC SI (MISCELLANEOUS) IMPLANT
KIT TURNOVER KIT A (KITS) IMPLANT
LIGASURE IMPACT 36 18CM CVD LR (INSTRUMENTS) IMPLANT
MANIPULATOR ADVINCU DEL 3.0 PL (MISCELLANEOUS) IMPLANT
MANIPULATOR ADVINCU DEL 3.5 PL (MISCELLANEOUS) IMPLANT
MANIPULATOR UTERINE 4.5 ZUMI (MISCELLANEOUS) IMPLANT
NDL HYPO 21X1.5 SAFETY (NEEDLE) ×2 IMPLANT
NDL SPNL 18GX3.5 QUINCKE PK (NEEDLE) IMPLANT
NEEDLE HYPO 21X1.5 SAFETY (NEEDLE) ×1 IMPLANT
NEEDLE SPNL 18GX3.5 QUINCKE PK (NEEDLE) ×1 IMPLANT
OBTURATOR OPTICALSTD 8 DVNC (TROCAR) ×2 IMPLANT
PACK ROBOT GYN CUSTOM WL (TRAY / TRAY PROCEDURE) ×2 IMPLANT
PAD POSITIONING PINK XL (MISCELLANEOUS) ×2 IMPLANT
PORT ACCESS TROCAR AIRSEAL 12 (TROCAR) IMPLANT
SCISSORS MNPLR CVD DVNC XI (INSTRUMENTS) ×2 IMPLANT
SCRUB CHG 4% DYNA-HEX 4OZ (MISCELLANEOUS) IMPLANT
SEAL UNIV 5-12 XI (MISCELLANEOUS) ×8 IMPLANT
SET TRI-LUMEN FLTR TB AIRSEAL (TUBING) ×2 IMPLANT
SPIKE FLUID TRANSFER (MISCELLANEOUS) ×2 IMPLANT
SPONGE T-LAP 18X18 ~~LOC~~+RFID (SPONGE) IMPLANT
SURGIFLO W/THROMBIN 8M KIT (HEMOSTASIS) IMPLANT
SUT MNCRL AB 4-0 PS2 18 (SUTURE) IMPLANT
SUT PDS AB 1 TP1 96 (SUTURE) IMPLANT
SUT STRATA PDS 0 30 CT-2.5 (SUTURE) IMPLANT
SUT V-LOC 180 0-0 GS22 (SUTURE) IMPLANT
SUT VIC AB 0 CT1 27XBRD ANTBC (SUTURE) IMPLANT
SUT VIC AB 2-0 CT1 TAPERPNT 27 (SUTURE) IMPLANT
SUT VIC AB 2-0 SH 27X BRD (SUTURE) IMPLANT
SUT VIC AB 4-0 PS2 18 (SUTURE) ×4 IMPLANT
SUT VICRYL 0 27 CT2 27 ABS (SUTURE) ×2 IMPLANT
SUT VLOC 180 0 9IN GS21 (SUTURE) IMPLANT
SYR 10ML LL (SYRINGE) IMPLANT
SYSTEM BAG RETRIEVAL 10MM (BASKET) IMPLANT
SYSTEM WOUND ALEXIS 18CM MED (MISCELLANEOUS) IMPLANT
TRAP SPECIMEN MUCUS 40CC (MISCELLANEOUS) IMPLANT
TRAY FOLEY MTR SLVR 16FR STAT (SET/KITS/TRAYS/PACK) ×2 IMPLANT
TROCAR PORT AIRSEAL 5X120 (TROCAR) IMPLANT
UNDERPAD 30X36 HEAVY ABSORB (UNDERPADS AND DIAPERS) ×4 IMPLANT
WATER STERILE IRR 1000ML POUR (IV SOLUTION) ×2 IMPLANT
YANKAUER SUCT BULB TIP 10FT TU (MISCELLANEOUS) IMPLANT

## 2024-04-02 NOTE — Interval H&P Note (Signed)
 History and Physical Interval Note:  04/02/2024 9:41 AM  [name]  has presented today for surgery, with the diagnosis of ENDOMETRIAL CANCER.  The various methods of treatment have been discussed with the patient and family. After consideration of risks, benefits and other options for treatment, the patient has consented to  Procedure(s): HYSTERECTOMY, TOTAL, ROBOT-ASSISTED, LAPAROSCOPIC, WITH BILATERAL SALPINGO-OOPHORECTOMY (N/A) INJECTION, FOR SENTINEL LYMPH NODE IDENTIFICATION (N/A) LYMPH NODE BIOPSY (N/A) LYMPHADENECTOMY, PELVIS (N/A) as a surgical intervention.  The patient's history has been reviewed, patient examined, no change in status, stable for surgery.  I have reviewed the patient's chart and labs.  Questions were answered to the patient's satisfaction.     Suzi Essex

## 2024-04-02 NOTE — Op Note (Signed)
 OPERATIVE NOTE  Pre-operative Diagnosis: endometrial cancer grade 1  Post-operative Diagnosis: same  Operation: Robotic-assisted laparoscopic total hysterectomy with bilateral salpingoophorectomy, SLN biopsy bilaterally, oversew bladder   Surgeon: Wiley Hanger MD  Assistant Surgeon: Vira Grieves, NP (an NP assistant was necessary for tissue manipulation, management of robotic instrumentation, retraction and positioning due to the complexity of the case and hospital policies).   Anesthesia: GET  Urine Output: 75 cc  Operative Findings: On EUA, small mobile uterus. ON intra-abdominal entry, normal upper abdominal survey. Normal small and large bowel, omentum. Normal bilateral adnexa. Mapping successful to bilateral obturator SLNs. No adenopathy. Uterus 6-8 and normal in appearance. Very small disruption of bladder serosa (<5 mm) noted after bladder flap performed, oversewed.  Estimated Blood Loss:  75 cc      Total IV Fluids: see I&O flowsheet         Specimens: uterus, cervix, bilateral tubes and ovaries, bilateral obturator SLNs         Complications:  None apparent; patient tolerated the procedure well.         Disposition: PACU - hemodynamically stable.  Procedure Details  The patient was seen in the Holding Room. The risks, benefits, complications, treatment options, and expected outcomes were discussed with the patient.  The patient concurred with the proposed plan, giving informed consent.  The site of surgery properly noted/marked. The patient was identified as [name] and the procedure verified as a Robotic-assisted hysterectomy with bilateral salpingo oophorectomy with SLN biopsy.   After induction of anesthesia, the patient was draped and prepped in the usual sterile manner. Patient was placed in supine position after anesthesia and draped and prepped in the usual sterile manner as follows: Her arms were tucked to her side with all appropriate precautions.  The  patient was secured to the bed using padding and tape across her chest.  The patient was placed in the semi-lithotomy position in [place] stirrups.  The perineum and vagina were prepped with CHG. The patient's abdomen was prepped with ChloraPrep and she was draped after the prep had been allowed to dry for 3 minutes.  A Time Out was held and the above information confirmed.  The urethra was prepped with CHG. Foley catheter was placed.  A sterile speculum was placed in the vagina.  The cervix was grasped with a single-tooth tenaculum. 2mg  total of ICG was injected into the cervical stroma at 2 and 9 o'clock with 1cc injected at a 1cm and 2mm depth (concentration 0.5mg /ml) in all locations. The cervix was dilated with Adriana Hopping dilators.  The ZUMI uterine manipulator with a medium colpotomizer ring was placed without difficulty.  A pneum occluder balloon was placed over the manipulator.  OG tube placement was confirmed and to suction.   Next, a 10 mm skin incision was made 1 cm below the subcostal margin in the midclavicular line.  The 5 mm Optiview port and scope was used for direct entry.  Opening pressure was under 10 mm CO2.  The abdomen was insufflated and the findings were noted as above.   At this point and all points during the procedure, the patient's intra-abdominal pressure did not exceed 15 mmHg. Next, an 8 mm skin incision was made superior to the umbilicus and a right and left port were placed about 8 cm lateral to the robot port on the right and left side.  A fourth arm was placed on the right.  The 5 mm assist trocar was exchanged for a 10-12 mm  port. All ports were placed under direct visualization.  The patient was placed in steep Trendelenburg.  Bowel was folded away into the upper abdomen.  The robot was docked in the normal manner.  The right and left peritoneum were opened parallel to the IP ligament to open the retroperitoneal spaces bilaterally. The round ligaments were transected. The SLN  mapping was performed in bilateral pelvic basins. After identifying the ureters, the para rectal and paravesical spaces were opened up entirely with careful dissection below the level of the ureters bilaterally and to the depth of the uterine artery origin in order to skeletonize the uterine "web" and ensure visualization of all parametrial channels. The para-aortic basins were carefully exposed and evaluated for isolated para-aortic SLNs. Lymphatic channels were identified travelling to the following visualized sentinel lymph nodes: bilateral obturator SLNs. These SLNs were separated from their surrounding lymphatic tissue, removed and sent for permanent pathology.  The hysterectomy was started.  The ureter was again noted to be on the medial leaf of the broad ligament.  The peritoneum above the ureter was incised and stretched and the infundibulopelvic ligament was skeletonized, cauterized and cut.  The posterior peritoneum was taken down to the level of the KOH ring.  The anterior peritoneum was also taken down.  The bladder flap was created to the level of the KOH ring.  The uterine artery on the right side was skeletonized, cauterized and cut in the normal manner.  A similar procedure was performed on the left.  The colpotomy was made and the uterus, cervix, bilateral ovaries and tubes were amputated and delivered through the vagina.  Pedicles were inspected and excellent hemostasis was achieved.    The colpotomy at the vaginal cuff was closed with 0 Vicryl with a figure of eight stitch at each apex and 0 V-Loc to close the midportion of the cuff in 2 layers in a running manner.  Small area of very minimal disruption to the bladder serosa was oversewn with a figure-of-eight using 2-0 Vicryl.  Irrigation was used and excellent hemostasis was achieved.  Arista was placed within the pelvic nodal basins. At this point in the procedure was completed.  Robotic instruments were removed under direct visulaization.   The robot was undocked. The fascia at the 12 mm port was closed with 0 Vicryl on a UR-5 needle.  The subcuticular tissue was closed with 4-0 Vicryl and the skin was closed with 4-0 Monocryl in a subcuticular manner.  Dermabond was applied.    The vagina was swabbed with  minimal bleeding noted. Foley catheter was removed  All sponge, lap and needle counts were correct x  3.   The patient was transferred to the recovery room in stable condition.  Wiley Hanger, MD

## 2024-04-02 NOTE — Anesthesia Postprocedure Evaluation (Signed)
 Anesthesia Post Note  Patient: [name]  Procedure(s) Performed: HYSTERECTOMY, TOTAL, ROBOT-ASSISTED, LAPAROSCOPIC, WITH BILATERAL SALPINGO-OOPHORECTOMY INJECTION, FOR SENTINEL LYMPH NODE IDENTIFICATION LYMPH NODE BIOPSY     Patient location during evaluation: PACU Anesthesia Type: General Level of consciousness: sedated and patient cooperative Pain management: pain level controlled Vital Signs Assessment: post-procedure vital signs reviewed and stable Respiratory status: spontaneous breathing Cardiovascular status: stable Anesthetic complications: no   No notable events documented.  Last Vitals:  Vitals:   04/02/24 1315 04/02/24 1335  BP: 137/70 (!) 147/78  Pulse: 82 85  Resp: 10   Temp: (!) 36.4 C   SpO2: 94% 95%    Last Pain:  Vitals:   04/02/24 1335  TempSrc:   PainSc: 4                  Gorman Laughter

## 2024-04-02 NOTE — Discharge Instructions (Signed)
 AFTER SURGERY INSTRUCTIONS   Return to work: 4-6 weeks if applicable   Activity: 1. Be up and out of the bed during the day.  Take a nap if needed.  You may walk up steps but be careful and use the hand rail.  Stair climbing will tire you more than you think, you may need to stop part way and rest.    2. No lifting or straining for 6 weeks over 10 pounds. No pushing, pulling, straining for 6 weeks.   3. No driving for 1-61 days when the following criteria have been met: Do not drive if you are taking narcotic pain medicine and make sure that your reaction time has returned.    4. You can shower as soon as the next day after surgery. Shower daily.  Use your regular soap and water  (not directly on the incision) and pat your incision(s) dry afterwards; don't rub.  No tub baths or submerging your body in water  until cleared by your surgeon. If you have the soap that was given to you by pre-surgical testing that was used before surgery, you do not need to use it afterwards because this can irritate your incisions.    5. No sexual activity and nothing in the vagina for 12 weeks.   6. You may experience a small amount of clear drainage from your incisions, which is normal.  If the drainage persists, increases, or changes color please call the office.   7. Do not use creams, lotions, or ointments such as neosporin on your incisions after surgery until advised by your surgeon because they can cause removal of the dermabond glue on your incisions.     8. You may experience vaginal spotting after surgery or when the stitches at the top of the vagina begin to dissolve.  The spotting is normal but if you experience heavy bleeding, call our office.   9. Take Tylenol or ibuprofen first for pain if you are able to take these medications and only use narcotic pain medication for severe pain not relieved by the Tylenol or Ibuprofen.  Monitor your Tylenol intake to a max of 4,000 mg in a 24 hour period. You can  alternate these medications after surgery.   Diet: 1. Low sodium Heart Healthy Diet is recommended but you are cleared to resume your normal (before surgery) diet after your procedure.   2. It is safe to use a laxative, such as Miralax or Colace, if you have difficulty moving your bowels before surgery. You have been prescribed Sennakot-S to take at bedtime every evening after surgery to keep bowel movements regular and to prevent constipation.     Wound Care: 1. Keep clean and dry.  Shower daily.   Reasons to call the Doctor: Fever - Oral temperature greater than 100.4 degrees Fahrenheit Foul-smelling vaginal discharge Difficulty urinating Nausea and vomiting Increased pain at the site of the incision that is unrelieved with pain medicine. Difficulty breathing with or without chest pain New calf pain especially if only on one side Sudden, continuing increased vaginal bleeding with or without clots.   Contacts: For questions or concerns you should contact:   Dr. Wiley Hanger at [phone]   Vira Grieves, NP at [phone]   After Hours: call [phone] and have the GYN Oncologist paged/contacted (after 5 pm or on the weekends). You will speak with an after hours RN and let he or she know you have had surgery.   Messages sent via mychart are for non-urgent  matters and are not responded to after hours so for urgent needs, please call the after hours number.

## 2024-04-02 NOTE — Transfer of Care (Signed)
 Immediate Anesthesia Transfer of Care Note  Patient: [name]  Procedure(s) Performed: HYSTERECTOMY, TOTAL, ROBOT-ASSISTED, LAPAROSCOPIC, WITH BILATERAL SALPINGO-OOPHORECTOMY INJECTION, FOR SENTINEL LYMPH NODE IDENTIFICATION LYMPH NODE BIOPSY  Patient Location: PACU  Anesthesia Type:General  Level of Consciousness: awake, alert , and oriented  Airway & Oxygen Therapy: Patient Spontanous Breathing and Patient connected to face mask oxygen  Post-op Assessment: Report given to RN and Post -op Vital signs reviewed and stable  Post vital signs: Reviewed and stable  Last Vitals:  Vitals Value Taken Time  BP 147/75 04/02/24 1242  Temp    Pulse 81 04/02/24 1245  Resp 8 04/02/24 1245  SpO2 100 % 04/02/24 1245  Vitals shown include unfiled device data.  Last Pain:  Vitals:   04/02/24 0903  TempSrc:   PainSc: 0-No pain      Patients Stated Pain Goal: 5 (04/02/24 0858)  Complications: No notable events documented.

## 2024-04-02 NOTE — Anesthesia Procedure Notes (Signed)
 Procedure Name: Intubation Date/Time: 04/02/2024 10:32 AM  Performed by: Linard Reno, CRNAPre-anesthesia Checklist: Patient identified, Emergency Drugs available, Suction available and Patient being monitored Patient Re-evaluated:Patient Re-evaluated prior to induction Oxygen Delivery Method: Circle System Utilized Preoxygenation: Pre-oxygenation with 100% oxygen Induction Type: IV induction Ventilation: Mask ventilation without difficulty Laryngoscope Size: Mac and 3 Grade View: Grade I Tube type: Oral Tube size: 7.5 mm Number of attempts: 1 Airway Equipment and Method: Stylet and Oral airway Placement Confirmation: ETT inserted through vocal cords under direct vision, positive ETCO2 and breath sounds checked- equal and bilateral Secured at: 21 cm Tube secured with: Tape Dental Injury: Teeth and Oropharynx as per pre-operative assessment

## 2024-04-03 ENCOUNTER — Telehealth: Payer: Self-pay | Admitting: *Deleted

## 2024-04-03 ENCOUNTER — Encounter (HOSPITAL_COMMUNITY): Payer: Self-pay | Admitting: Gynecologic Oncology

## 2024-04-03 NOTE — Telephone Encounter (Signed)
 Attempted to reach patient for post op call. Left voicemail requesting call back to [phone].

## 2024-04-03 NOTE — Telephone Encounter (Signed)
 Spoke with Ms. Dollinger this afternoon.. She states she is eating, drinking and urinating well. She has not had a BM yet but is passing gas. She is taking senokot as prescribed and encouraged her to drink plenty of water . She denies fever or chills. Incisions are dry and intact. She rates her pain 4/10. Her pain is controlled with ibuprofen. Pt states she only took one tramadol  last night and she is feeling better today with only using ibuprofen.     Instructed to call office with any fever, chills, purulent drainage, uncontrolled pain or any other questions or concerns. Patient verbalizes understanding.   Pt aware of post op appointments as well as the office number [phone] and after hours number [phone] to call if she has any questions or concerns

## 2024-04-06 ENCOUNTER — Ambulatory Visit: Payer: Self-pay | Admitting: Gynecologic Oncology

## 2024-04-08 ENCOUNTER — Inpatient Hospital Stay: Admitting: Gynecologic Oncology

## 2024-04-08 ENCOUNTER — Encounter: Payer: Self-pay | Admitting: Gynecologic Oncology

## 2024-04-08 DIAGNOSIS — Z90710 Acquired absence of both cervix and uterus: Secondary | ICD-10-CM

## 2024-04-08 DIAGNOSIS — Z90722 Acquired absence of ovaries, bilateral: Secondary | ICD-10-CM

## 2024-04-08 DIAGNOSIS — Z7189 Other specified counseling: Secondary | ICD-10-CM

## 2024-04-08 DIAGNOSIS — C541 Malignant neoplasm of endometrium: Secondary | ICD-10-CM

## 2024-04-08 DIAGNOSIS — Z9079 Acquired absence of other genital organ(s): Secondary | ICD-10-CM

## 2024-04-08 NOTE — Progress Notes (Signed)
 Gynecologic Oncology Telehealth Note: Gyn-Onc  I connected with [name] on 04/08/24 at  6:00 PM EDT by telephone and verified that I am speaking with the correct person using two identifiers.  I discussed the limitations, risks, security and privacy concerns of performing an evaluation and management service by telemedicine and the availability of in-person appointments. I also discussed with the patient that there may be a patient responsible charge related to this service. The patient expressed understanding and agreed to proceed.  Other persons participating in the visit and their role in the encounter: none.  Patient's location: home Provider's location: [facility]  Reason for Visit: follow-up  Treatment History: 03/11/2024: Patient seen for postmenopausal bleeding.  Initially had 3 days of pink spotting and cramping in April.  Another several episodes in early May. 03/11/24: Endometrial biopsy reveals grade 1 endometrioid adenocarcinoma. 03/22/24: Uterus measures 7.2 x 2.2 x 4.2 cm.  Endometrium 19 mm.  Ovaries normal  04/02/24: Robotic-assisted laparoscopic total hysterectomy with bilateral salpingoophorectomy, SLN biopsy bilaterally, oversew bladder   Interval History: Doing well. Soreness is improving. Bowels moving normally. Denies any urinary symptoms. Denies vaginal bleeding.   Past Medical/Surgical History: Past Medical History:  Diagnosis Date   Anal fissure    Anxiety    Carpal tunnel syndrome on both sides    Complication of anesthesia    Hard to sedate, woke up during carpal tunnel and colonoscopies in the past   Endometrial cancer (HCC)    Hyperlipidemia    Hypertension     Past Surgical History:  Procedure Laterality Date   BREAST BIOPSY Left 06/23/2012   fibroadenomatoid change with calcifications, fibrocystic change with usual ductal hyperplasia, and pseudoangiomatous stromal hyperplasia (PASH)   CARPAL TUNNEL RELEASE Bilateral MAY/JUL 2004   COLONOSCOPY   05/20/2007   ZOX:WRUEAV colon   COLONOSCOPY N/A 09/13/2017   Procedure: COLONOSCOPY;  Surgeon: Alyce Jubilee, MD;  [facility];  Service: Endoscopy;  Laterality: N/A;  10:30 AM   COLONOSCOPY WITH PROPOFOL  N/A 11/27/2022   Procedure: COLONOSCOPY WITH PROPOFOL ;  Surgeon: Vinetta Greening, DO;  [facility];  Service: Endoscopy;  Laterality: N/A;  9:30 am   INJECTION, FOR SENTINEL LYMPH NODE IDENTIFICATION N/A 04/02/2024   Procedure: INJECTION, FOR SENTINEL LYMPH NODE IDENTIFICATION;  Surgeon: Suzi Essex, MD;  Location: [facility];  Service: Gynecology;  Laterality: N/A;   LYMPH NODE BIOPSY N/A 04/02/2024   Procedure: LYMPH NODE BIOPSY;  Surgeon: Suzi Essex, MD;  Location: [facility];  Service: Gynecology;  Laterality: N/A;   POLYPECTOMY  11/27/2022   Procedure: POLYPECTOMY;  Surgeon: Vinetta Greening, DO;  [facility];  Service: Endoscopy;;   ROBOTIC ASSISTED TOTAL HYSTERECTOMY WITH BILATERAL SALPINGO OOPHERECTOMY N/A 04/02/2024   Procedure: HYSTERECTOMY, TOTAL, ROBOT-ASSISTED, LAPAROSCOPIC, WITH BILATERAL SALPINGO-OOPHORECTOMY;  Surgeon: Suzi Essex, MD;  Location: [facility];  Service: Gynecology;  Laterality: N/A;    Family History  Problem Relation Age of Onset   Breast cancer Mother    Kidney failure Father    Breast cancer Sister    Melanoma Sister    Colon cancer Maternal Grandmother    Ovarian cancer Paternal Grandmother    Cancer Paternal Grandmother        ovarian   Colon polyps Neg Hx    Pancreatic cancer Neg Hx    Prostate cancer Neg Hx     Social History   Socioeconomic History   Marital status: Married    Spouse name: Not  on file   Number of children: Not on file   Years of education: Not on file   Highest education level: Not on file  Occupational History   Not on file  Tobacco Use   Smoking status: Former    Passive exposure: Never   Smokeless tobacco: Never   Tobacco comments:    quit 1990's  Vaping Use    Vaping status: Never Used  Substance and Sexual Activity   Alcohol use: No   Drug use: No   Sexual activity: Yes    Birth control/protection: Post-menopausal  Other Topics Concern   Not on file  Social History Narrative   Not on file   Social Drivers of Health   Financial Resource Strain: Low Risk  (03/11/2024)   Overall Financial Resource Strain (CARDIA)    Difficulty of Paying Living Expenses: Not very hard  Food Insecurity: No Food Insecurity (03/11/2024)   Hunger Vital Sign    Worried About Running Out of Food in the Last Year: Never true    Ran Out of Food in the Last Year: Never true  Transportation Needs: No Transportation Needs (03/11/2024)   PRAPARE - Administrator, Civil Service (Medical): No    Lack of Transportation (Non-Medical): No  Physical Activity: Insufficiently Active (03/11/2024)   Exercise Vital Sign    Days of Exercise per Week: 3 days    Minutes of Exercise per Session: 30 min  Stress: Stress Concern Present (03/11/2024)   [employer] of Occupational Health - Occupational Stress Questionnaire    Feeling of Stress : Rather much  Social Connections: Moderately Integrated (03/11/2024)   Social Connection and Isolation Panel [NHANES]    Frequency of Communication with Friends and Family: Three times a week    Frequency of Social Gatherings with Friends and Family: Once a week    Attends Religious Services: 1 to 4 times per year    Active Member of [employer] or Organizations: No    Attends Engineer, structural: Never    Marital Status: Married    Current Medications:  Current Outpatient Medications:    acetaminophen  (TYLENOL ) 500 MG tablet, Take 500 mg by mouth every 6 (six) hours as needed for moderate pain., Disp: , Rfl:    ALPRAZolam (XANAX) 0.5 MG tablet, Take 0.5 mg 2 (two) times daily as needed by mouth (for anxiety). , Disp: , Rfl:    [Paused] aspirin EC 81 MG tablet, Take 81 mg by mouth every other day. At bedtime., Disp: , Rfl:     ezetimibe (ZETIA) 10 MG tablet, Take 5 mg by mouth at bedtime., Disp: , Rfl:    fluticasone (FLONASE) 50 MCG/ACT nasal spray, Place 1 spray into both nostrils daily as needed for allergies or rhinitis., Disp: , Rfl:    lactase (LACTAID) 3000 units tablet, Take 3,000 Units by mouth daily as needed (when eating ice cream)., Disp: , Rfl:    lisinopril (PRINIVIL,ZESTRIL) 10 MG tablet, Take 10 mg by mouth daily. , Disp: , Rfl:    senna-docusate (SENOKOT-S) 8.6-50 MG tablet, Take 2 tablets by mouth at bedtime. For AFTER surgery, do not take if having diarrhea, Disp: 30 tablet, Rfl: 0   simvastatin (ZOCOR) 40 MG tablet, Take 40 mg by mouth at bedtime., Disp: , Rfl:    traMADol  (ULTRAM ) 50 MG tablet, Take 1 tablet (50 mg total) by mouth every 6 (six) hours as needed for severe pain (pain score 7-10). For AFTER surgery only, do not take  and drive, Disp: 10 tablet, Rfl: 0  Review of Symptoms: Pertinent positives as per HPI.  Physical Exam: Deferred given limitations of phone visit.  Laboratory & Radiologic Studies: A. SENTINEL LYMPH NODE RIGHT OBTURATOR:  - Lymph node, negative for carcinoma (0/1)   B. SENTINEL LYMPH NODE LEFT OBTURATOR:  - Lymph node, negative for carcinoma (0/1)   C. UTERUS, CERVIX, BILATERAL TUBES AND OVARIES:  - Endometrioid carcinoma, FIGO grade 1, approximately 2.8 cm  - Myometrial invasion is not identified  - No evidence of lymphovascular invasion  - Other benign endometrial polyps  - Benign unremarkable cervix  - Benign unremarkable bilateral ovaries and fallopian tubes  - See oncology table   Assessment & Plan: [name] is a 63 y.o. woman with Stage IA1 grade 1 endometrioid endometrial adenocarcinoma who presents for follow-up.  Doing well, meeting postoperative milestones. Discussed continued expectations.  Reviewed pathology from surgery.  Patient very happy with this news.  Aware to call with any concerns prior to her in person follow-up.  I discussed  the assessment and treatment plan with the patient. The patient was provided with an opportunity to ask questions and all were answered. The patient agreed with the plan and demonstrated an understanding of the instructions.   The patient was advised to call back or see an in-person evaluation if the symptoms worsen or if the condition fails to improve as anticipated.   6 minutes of total time was spent for this patient encounter, including preparation, phone counseling with the patient and coordination of care, and documentation of the encounter.   Wiley Hanger, MD  Division of Gynecologic Oncology  Department of Obstetrics and Gynecology  [facility] [place]

## 2024-04-09 ENCOUNTER — Ambulatory Visit: Admitting: Gynecologic Oncology

## 2024-04-14 ENCOUNTER — Inpatient Hospital Stay: Admitting: Licensed Clinical Social Worker

## 2024-04-14 NOTE — Progress Notes (Signed)
 [facility] CSW Progress Note  Clinical Child psychotherapist contacted patient by phone to follow-up on coping post-surgery. Pt reports to be doing well. The first week was difficult but every day feels like an improvement. Pt is having to remind herself to take it easy some days. Pt is doing well emotionally and mentally and is relieved that pathology confirmed that the cancer was contained.  CSW reminded pt of supports available if needed in the future.    Kiandria Clum E Deanglo Hissong, LCSW Clinical Social Worker [facility]

## 2024-04-15 LAB — SURGICAL PATHOLOGY

## 2024-05-06 ENCOUNTER — Encounter: Payer: Self-pay | Admitting: Genetic Counselor

## 2024-05-06 ENCOUNTER — Encounter: Payer: Self-pay | Admitting: Gynecologic Oncology

## 2024-05-07 ENCOUNTER — Encounter: Payer: Self-pay | Admitting: Gynecologic Oncology

## 2024-05-07 ENCOUNTER — Encounter: Payer: Self-pay | Admitting: Oncology

## 2024-05-07 ENCOUNTER — Inpatient Hospital Stay: Attending: Licensed Clinical Social Worker | Admitting: Gynecologic Oncology

## 2024-05-07 VITALS — BP 139/73 | HR 87 | Temp 98.0°F | Resp 19 | Wt 224.6 lb

## 2024-05-07 DIAGNOSIS — C541 Malignant neoplasm of endometrium: Secondary | ICD-10-CM

## 2024-05-07 DIAGNOSIS — Z8041 Family history of malignant neoplasm of ovary: Secondary | ICD-10-CM

## 2024-05-07 DIAGNOSIS — Z7189 Other specified counseling: Secondary | ICD-10-CM

## 2024-05-07 DIAGNOSIS — Z90722 Acquired absence of ovaries, bilateral: Secondary | ICD-10-CM

## 2024-05-07 DIAGNOSIS — Z90710 Acquired absence of both cervix and uterus: Secondary | ICD-10-CM

## 2024-05-07 DIAGNOSIS — Z9079 Acquired absence of other genital organ(s): Secondary | ICD-10-CM

## 2024-05-07 DIAGNOSIS — Z800 Family history of malignant neoplasm of digestive organs: Secondary | ICD-10-CM

## 2024-05-07 NOTE — Progress Notes (Signed)
 Gynecologic Oncology Return Clinic Visit  05/07/24  Reason for Visit: follow-up, treatment planning  Treatment History: 03/11/2024: Patient seen for postmenopausal bleeding.  Initially had 3 days of pink spotting and cramping in April.  Another several episodes in early May. 03/11/24: Endometrial biopsy reveals grade 1 endometrioid adenocarcinoma. 03/22/24: Uterus measures 7.2 x 2.2 x 4.2 cm.  Endometrium 19 mm.  Ovaries normal   04/02/24: Robotic-assisted laparoscopic total hysterectomy with bilateral salpingoophorectomy, SLN biopsy bilaterally, oversew bladder   Interval History: Doing well.  Had spotting for the first couple of days after surgery, denies any recent bleeding.  Reports normal bowel function.  Baseline bladder function.  Has some abdominal soreness, denies any significant pain.  Past Medical/Surgical History: Past Medical History:  Diagnosis Date   Anal fissure    Anxiety    Carpal tunnel syndrome on both sides    Complication of anesthesia    Hard to sedate, woke up during carpal tunnel and colonoscopies in the past   Endometrial cancer (HCC)    Hyperlipidemia    Hypertension     Past Surgical History:  Procedure Laterality Date   BREAST BIOPSY Left 06/23/2012   fibroadenomatoid change with calcifications, fibrocystic change with usual ductal hyperplasia, and pseudoangiomatous stromal hyperplasia (PASH)   CARPAL TUNNEL RELEASE Bilateral MAY/JUL 2004   COLONOSCOPY  05/20/2007   DOQ:Wnmfjo colon   COLONOSCOPY N/A 09/13/2017   Procedure: COLONOSCOPY;  Surgeon: Harvey Margo CROME, MD;  [facility];  Service: Endoscopy;  Laterality: N/A;  10:30 AM   COLONOSCOPY WITH PROPOFOL  N/A 11/27/2022   Procedure: COLONOSCOPY WITH PROPOFOL ;  Surgeon: Cindie Carlin POUR, DO;  [facility];  Service: Endoscopy;  Laterality: N/A;  9:30 am   INJECTION, FOR SENTINEL LYMPH NODE IDENTIFICATION N/A 04/02/2024   Procedure: INJECTION, FOR SENTINEL LYMPH NODE  IDENTIFICATION;  Surgeon: Viktoria Comer SAUNDERS, MD;  [facility]: [facility];  Service: Gynecology;  Laterality: N/A;   LYMPH NODE BIOPSY N/A 04/02/2024   Procedure: LYMPH NODE BIOPSY;  Surgeon: Viktoria Comer SAUNDERS, MD;  [facility];  Service: Gynecology;  Laterality: N/A;   POLYPECTOMY  11/27/2022   Procedure: POLYPECTOMY;  Surgeon: Cindie Carlin POUR, DO;  [facility];  Service: Endoscopy;;   ROBOTIC ASSISTED TOTAL HYSTERECTOMY WITH BILATERAL SALPINGO OOPHERECTOMY N/A 04/02/2024   Procedure: HYSTERECTOMY, TOTAL, ROBOT-ASSISTED, LAPAROSCOPIC, WITH BILATERAL SALPINGO-OOPHORECTOMY;  Surgeon: Viktoria Comer SAUNDERS, MD;  Location: [facility];  Service: Gynecology;  Laterality: N/A;    Family History  Problem Relation Age of Onset   Breast cancer Mother    Kidney failure Father    Breast cancer Sister    Melanoma Sister    Colon cancer Maternal Grandmother    Ovarian cancer Paternal Grandmother    Cancer Paternal Grandmother        ovarian   Colon polyps Neg Hx    Pancreatic cancer Neg Hx    Prostate cancer Neg Hx     Social History   Socioeconomic History   Marital status: Married    Spouse name: Not on file   Number of children: Not on file   Years of education: Not on file   Highest education level: Not on file  Occupational History   Not on file  Tobacco Use   Smoking status: Former    Passive exposure: Never   Smokeless tobacco: Never   Tobacco comments:    quit 1990's  Vaping Use   Vaping status: Never Used  Substance and Sexual Activity  Alcohol use: No   Drug use: No   Sexual activity: Yes    Birth control/protection: Post-menopausal  Other Topics Concern   Not on file  Social History Narrative   Not on file   Social Drivers of Health   Financial Resource Strain: Low Risk  (03/11/2024)   Overall Financial Resource Strain (CARDIA)    Difficulty of Paying Living Expenses: Not very hard  Food Insecurity: No Food Insecurity (03/11/2024)   Hunger Vital Sign     Worried About Running Out of Food in the Last Year: Never true    Ran Out of Food in the Last Year: Never true  Transportation Needs: No Transportation Needs (03/11/2024)   PRAPARE - Administrator, Civil Service (Medical): No    Lack of Transportation (Non-Medical): No  Physical Activity: Insufficiently Active (03/11/2024)   Exercise Vital Sign    Days of Exercise per Week: 3 days    Minutes of Exercise per Session: 30 min  Stress: Stress Concern Present (03/11/2024)   [employer] of Occupational Health - Occupational Stress Questionnaire    Feeling of Stress : Rather much  Social Connections: Moderately Integrated (03/11/2024)   Social Connection and Isolation Panel    Frequency of Communication with Friends and Family: Three times a week    Frequency of Social Gatherings with Friends and Family: Once a week    Attends Religious Services: 1 to 4 times per year    Active Member of [employer] or Organizations: No    Attends Engineer, structural: Never    Marital Status: Married    Current Medications:  Current Outpatient Medications:    acetaminophen  (TYLENOL ) 500 MG tablet, Take 500 mg by mouth every 6 (six) hours as needed for moderate pain., Disp: , Rfl:    ALPRAZolam (XANAX) 0.5 MG tablet, Take 0.5 mg 2 (two) times daily as needed by mouth (for anxiety). , Disp: , Rfl:    aspirin EC 81 MG tablet, Take 81 mg by mouth every other day. At bedtime., Disp: , Rfl:    ezetimibe (ZETIA) 10 MG tablet, Take 5 mg by mouth at bedtime., Disp: , Rfl:    fluticasone (FLONASE) 50 MCG/ACT nasal spray, Place 1 spray into both nostrils daily as needed for allergies or rhinitis., Disp: , Rfl:    lactase (LACTAID) 3000 units tablet, Take 3,000 Units by mouth daily as needed (when eating ice cream)., Disp: , Rfl:    lisinopril (PRINIVIL,ZESTRIL) 10 MG tablet, Take 10 mg by mouth daily. , Disp: , Rfl:    senna-docusate (SENOKOT-S) 8.6-50 MG tablet, Take 2 tablets by mouth at bedtime.  For AFTER surgery, do not take if having diarrhea, Disp: 30 tablet, Rfl: 0   simvastatin (ZOCOR) 40 MG tablet, Take 40 mg by mouth at bedtime., Disp: , Rfl:    traMADol  (ULTRAM ) 50 MG tablet, Take 1 tablet (50 mg total) by mouth every 6 (six) hours as needed for severe pain (pain score 7-10). For AFTER surgery only, do not take and drive, Disp: 10 tablet, Rfl: 0  Review of Systems: + Fatigue, anxiety Denies appetite changes, fevers, chills, unexplained weight changes. Denies hearing loss, neck lumps or masses, mouth sores, ringing in ears or voice changes. Denies cough or wheezing.  Denies shortness of breath. Denies chest pain or palpitations. Denies leg swelling. Denies abdominal distention, pain, blood in stools, constipation, diarrhea, nausea, vomiting, or early satiety. Denies pain with intercourse, dysuria, frequency, hematuria or incontinence. Denies hot flashes,  pelvic pain, vaginal bleeding or vaginal discharge.   Denies joint pain, back pain or muscle pain/cramps. Denies itching, rash, or wounds. Denies dizziness, headaches, numbness or seizures. Denies swollen lymph nodes or glands, denies easy bruising or bleeding. Denies depression, confusion, or decreased concentration.  Physical Exam: BP 139/73 (BP Location: Right Arm, Patient Position: Sitting)   Pulse 87   Temp 98 F (36.7 C) (Oral)   Resp 19   Wt 224 lb 9.6 oz (101.9 kg)   SpO2 99%   BMI 38.55 kg/m  General: Alert, oriented, no acute distress. HEENT: Posterior oropharynx clear, sclera anicteric. Chest: Unlabored breathing on room air. Abdomen: Obese, soft, nontender.  Normoactive bowel sounds.  No masses or hepatosplenomegaly appreciated.  Well-healed incisions. Extremities: Grossly normal range of motion.  Warm, well perfused.  No edema bilaterally. GU: Normal appearing external genitalia without erythema, excoriation, or lesions.  Speculum exam reveals cuff intact, suture visible.  Bimanual exam reveals cuff  intact, no fluctuance or tenderness to palpation.    Laboratory & Radiologic Studies: A. SENTINEL LYMPH NODE RIGHT OBTURATOR:  - Lymph node, negative for carcinoma (0/1)   B. SENTINEL LYMPH NODE LEFT OBTURATOR:  - Lymph node, negative for carcinoma (0/1)   C. UTERUS, CERVIX, BILATERAL TUBES AND OVARIES:  - Endometrioid carcinoma, FIGO grade 1, approximately 2.8 cm  - Myometrial invasion is not identified  - No evidence of lymphovascular invasion  - Other benign endometrial polyps  - Benign unremarkable cervix  - Benign unremarkable bilateral ovaries and fallopian tubes  - See oncology table   Assessment & Plan: RALYNN SAN is a 63 y.o. woman with Stage IA1 grade 1 endometrioid endometrial adenocarcinoma who presents for follow-up. MMRd. P53 WT.   Doing well, meeting postoperative milestones. Discussed continued expectations and restrictions.  MSI and MLH1 promoter methylation testing will need to be done.  My office reach out to pathology to ensure that these get ordered.  Discussed in the setting of her recent cancer diagnosis as well as strong family history of various cancers recommendation for referral to genetics.  Secondary to anxiety, the patient would like to wait on referral at this time.  We will plan to discuss at her follow-up visit in 6 months.  We reviewed final pathology from surgery.  She was given a copy of this report.  Given early stage, low risk disease, discussed no indication for adjuvant treatment.  We reviewed surveillance plan which will include visits every 6 months initially alternating between my office and her OB/GYN with ultimate transition to surveillance with her OB/GYN.  I will see her for her first visit in 6 months.  We reviewed signs and symptoms that should prompt a phone call between visits.  20 minutes of total time was spent for this patient encounter, including preparation, face-to-face counseling with the patient and coordination of care,  and documentation of the encounter.  Comer Dollar, MD  Division of Gynecologic Oncology  Department of Obstetrics and Gynecology  [facility]

## 2024-05-07 NOTE — Progress Notes (Signed)
 Requested MSI and MLH1 promoter hypermethylation on accession [id number] with [facility].

## 2024-05-07 NOTE — Patient Instructions (Signed)
 It was good to see you today.  You are healing very well from surgery!  Please remember, no heavy lifting for 6 weeks after surgery and nothing in the vagina for at least 12 weeks.  I will see you for your first follow-up visit in 6 months.  Please call if you develop any vaginal bleeding, pelvic pain, change to bowel or bladder habits, unintentional weight loss, or any other concerning symptoms.  We will discuss again possible referral to see one of our genetic counselors at your next visit.

## 2024-05-26 ENCOUNTER — Encounter (HOSPITAL_COMMUNITY): Payer: Self-pay | Admitting: Gynecologic Oncology

## 2024-07-20 DIAGNOSIS — H35032 Hypertensive retinopathy, left eye: Secondary | ICD-10-CM | POA: Diagnosis not present

## 2024-07-28 ENCOUNTER — Other Ambulatory Visit (HOSPITAL_COMMUNITY): Payer: Self-pay | Admitting: Internal Medicine

## 2024-07-28 DIAGNOSIS — Z1231 Encounter for screening mammogram for malignant neoplasm of breast: Secondary | ICD-10-CM

## 2024-08-24 ENCOUNTER — Encounter (HOSPITAL_COMMUNITY): Payer: Self-pay

## 2024-08-24 ENCOUNTER — Ambulatory Visit (HOSPITAL_COMMUNITY)
Admission: RE | Admit: 2024-08-24 | Discharge: 2024-08-24 | Disposition: A | Discharge status: Home / Self Care | Source: Ambulatory Visit | Attending: Internal Medicine | Admitting: Internal Medicine

## 2024-08-24 DIAGNOSIS — Z1231 Encounter for screening mammogram for malignant neoplasm of breast: Secondary | ICD-10-CM | POA: Insufficient documentation

## 2024-11-06 ENCOUNTER — Telehealth: Payer: Self-pay | Admitting: *Deleted

## 2024-11-06 ENCOUNTER — Encounter: Payer: Self-pay | Admitting: Gynecologic Oncology

## 2024-11-06 ENCOUNTER — Inpatient Hospital Stay: Attending: Gynecologic Oncology | Admitting: Gynecologic Oncology

## 2024-11-06 ENCOUNTER — Encounter (HOSPITAL_COMMUNITY): Payer: Self-pay | Admitting: Gynecologic Oncology

## 2024-11-06 VITALS — BP 136/70 | HR 94 | Temp 97.8°F | Resp 16 | Wt 222.0 lb

## 2024-11-06 DIAGNOSIS — F419 Anxiety disorder, unspecified: Secondary | ICD-10-CM | POA: Insufficient documentation

## 2024-11-06 DIAGNOSIS — C541 Malignant neoplasm of endometrium: Secondary | ICD-10-CM | POA: Diagnosis not present

## 2024-11-06 DIAGNOSIS — Z90722 Acquired absence of ovaries, bilateral: Secondary | ICD-10-CM | POA: Diagnosis not present

## 2024-11-06 DIAGNOSIS — Z9079 Acquired absence of other genital organ(s): Secondary | ICD-10-CM | POA: Insufficient documentation

## 2024-11-06 DIAGNOSIS — Z8542 Personal history of malignant neoplasm of other parts of uterus: Secondary | ICD-10-CM | POA: Diagnosis not present

## 2024-11-06 DIAGNOSIS — Z90710 Acquired absence of both cervix and uterus: Secondary | ICD-10-CM | POA: Diagnosis not present

## 2024-11-06 NOTE — Patient Instructions (Signed)
 It was good to see you today.  I do not see or feel any evidence of cancer recurrence on your exam.  Please reach out to your OB/GYN to schedule follow-up in 6 months with them.  We will see you for follow-up in 12 months.  If you change her mind about genetic testing, please call my office and we can get you scheduled to see our genetic counselors.  As always, if you develop any new and concerning symptoms before your next visit, please call to see me sooner.

## 2024-11-06 NOTE — Telephone Encounter (Signed)
 Pt called to update her PCp in epic. PCP updated as Dr. Christyne Core.

## 2024-11-06 NOTE — Progress Notes (Signed)
 Gynecologic Oncology Return Clinic Visit  11/06/2024  Reason for Visit: surveillance   Treatment History: 03/11/2024: Patient seen for postmenopausal bleeding.  Initially had 3 days of pink spotting and cramping in April.  Another several episodes in early May. 03/11/24: Endometrial biopsy reveals grade 1 endometrioid adenocarcinoma. 03/22/24: Uterus measures 7.2 x 2.2 x 4.2 cm.  Endometrium 19 mm.  Ovaries normal    04/02/24: Robotic-assisted laparoscopic total hysterectomy with bilateral salpingoophorectomy, SLN biopsy bilaterally, oversew bladder   Interval History: Doing well.  Denies any vaginal bleeding.  No abdominal or pelvic pain.  Has some urinary incontinence, similar to prior to surgery.  Past Medical/Surgical History: Past Medical History:  Diagnosis Date   Anal fissure    Anxiety    Carpal tunnel syndrome on both sides    Complication of anesthesia    Hard to sedate, woke up during carpal tunnel and colonoscopies in the past   Endometrial cancer (HCC)    Hyperlipidemia    Hypertension     Past Surgical History:  Procedure Laterality Date   BREAST BIOPSY Left 06/23/2012   fibroadenomatoid change with calcifications, fibrocystic change with usual ductal hyperplasia, and pseudoangiomatous stromal hyperplasia (PASH)   CARPAL TUNNEL RELEASE Bilateral MAY/JUL 2004   COLONOSCOPY  05/20/2007   DOQ:Wnmfjo colon   COLONOSCOPY N/A 09/13/2017   Procedure: COLONOSCOPY;  Surgeon: Harvey Margo CROME, MD;  Location: [facility];  Service: Endoscopy;  Laterality: N/A;  10:30 AM   COLONOSCOPY WITH PROPOFOL  N/A 11/27/2022   Procedure: COLONOSCOPY WITH PROPOFOL ;  Surgeon: Cindie Carlin POUR, DO;  Location: [facility];  Service: Endoscopy;  Laterality: N/A;  9:30 am   INJECTION, FOR SENTINEL LYMPH NODE IDENTIFICATION N/A 04/02/2024   Procedure: INJECTION, FOR SENTINEL LYMPH NODE IDENTIFICATION;  Surgeon: Viktoria Comer SAUNDERS, MD;  Location: [facility];  Service: Gynecology;  Laterality: N/A;    LYMPH NODE BIOPSY N/A 04/02/2024   Procedure: LYMPH NODE BIOPSY;  Surgeon: Viktoria Comer SAUNDERS, MD;  Location: [facility];  Service: Gynecology;  Laterality: N/A;   POLYPECTOMY  11/27/2022   Procedure: POLYPECTOMY;  Surgeon: Cindie Carlin POUR, DO;  Location: [facility];  Service: Endoscopy;;   ROBOTIC ASSISTED TOTAL HYSTERECTOMY WITH BILATERAL SALPINGO OOPHERECTOMY N/A 04/02/2024   Procedure: HYSTERECTOMY, TOTAL, ROBOT-ASSISTED, LAPAROSCOPIC, WITH BILATERAL SALPINGO-OOPHORECTOMY;  Surgeon: Viktoria Comer SAUNDERS, MD;  Location: [facility];  Service: Gynecology;  Laterality: N/A;    Family History  Problem Relation Age of Onset   Breast cancer Mother    Kidney failure Father    Breast cancer Sister    Melanoma Sister    Colon cancer Maternal Grandmother    Ovarian cancer Paternal Grandmother    Cancer Paternal Grandmother        ovarian   Colon polyps Neg Hx    Pancreatic cancer Neg Hx    Prostate cancer Neg Hx     Social History   Socioeconomic History   Marital status: Married    Spouse name: Not on file   Number of children: Not on file   Years of education: Not on file   Highest education level: Not on file  Occupational History   Not on file  Tobacco Use   Smoking status: Former    Passive exposure: Never   Smokeless tobacco: Never   Tobacco comments:    quit 1990's  Vaping Use   Vaping status: Never Used  Substance and Sexual Activity   Alcohol use: No   Drug use: No   Sexual  activity: Yes    Birth control/protection: Post-menopausal  Other Topics Concern   Not on file  Social History Narrative   Not on file   Social Drivers of Health   Tobacco Use: Medium Risk (11/06/2024)   Patient History    Smoking Tobacco Use: Former    Smokeless Tobacco Use: Never    Passive Exposure: Never  Physicist, Medical Strain: Low Risk (03/11/2024)   Overall Financial Resource Strain (CARDIA)    Difficulty of Paying Living Expenses: Not very hard  Food Insecurity: No Food Insecurity  (03/11/2024)   Hunger Vital Sign    Worried About Running Out of Food in the Last Year: Never true    Ran Out of Food in the Last Year: Never true  Transportation Needs: No Transportation Needs (03/11/2024)   PRAPARE - Administrator, Civil Service (Medical): No    Lack of Transportation (Non-Medical): No  Physical Activity: Insufficiently Active (03/11/2024)   Exercise Vital Sign    Days of Exercise per Week: 3 days    Minutes of Exercise per Session: 30 min  Stress: Stress Concern Present (03/11/2024)   [employer] of Occupational Health - Occupational Stress Questionnaire    Feeling of Stress : Rather much  Social Connections: Moderately Integrated (03/11/2024)   Social Connection and Isolation Panel    Frequency of Communication with Friends and Family: Three times a week    Frequency of Social Gatherings with Friends and Family: Once a week    Attends Religious Services: 1 to 4 times per year    Active Member of [employer] or Organizations: No    Attends Banker Meetings: Never    Marital Status: Married  Depression (PHQ2-9): High Risk (03/11/2024)   Depression (PHQ2-9)    PHQ-2 Score: 18  Alcohol Screen: Low Risk (03/11/2024)   Alcohol Screen    Last Alcohol Screening Score (AUDIT): 0  Housing: Low Risk (03/11/2024)   Housing Stability Vital Sign    Unable to Pay for Housing in the Last Year: No    Number of Times Moved in the Last Year: 0    Homeless in the Last Year: No  Utilities: Not At Risk (03/11/2024)   [facility] Utilities    Threatened with loss of utilities: No  Health Literacy: Not on file    Current Medications: Current Medications[1]  Review of Systems: Denies appetite changes, fevers, chills, fatigue, unexplained weight changes. Denies hearing loss, neck lumps or masses, mouth sores, ringing in ears or voice changes. Denies cough or wheezing.  Denies shortness of breath. Denies chest pain or palpitations. Denies leg swelling. Denies  abdominal distention, pain, blood in stools, constipation, diarrhea, nausea, vomiting, or early satiety. Denies pain with intercourse, dysuria, frequency, hematuria. Denies hot flashes, pelvic pain, vaginal bleeding or vaginal discharge.   Denies joint pain, back pain or muscle pain/cramps. Denies itching, rash, or wounds. Denies dizziness, headaches, numbness or seizures. Denies swollen lymph nodes or glands, denies easy bruising or bleeding. Denies anxiety, depression, confusion, or decreased concentration.  Physical Exam: BP 136/70 (BP Location: Right Arm, Patient Position: Sitting)   Pulse 94   Temp 97.8 F (36.6 C) (Oral)   Resp 16   Wt 222 lb (100.7 kg)   SpO2 100%   BMI 38.11 kg/m  General: Alert, oriented, no acute distress. HEENT: Posterior oropharynx clear, sclera anicteric. Chest: Clear to auscultation bilaterally.  No wheezes or rhonchi. Cardiovascular: Regular rate and rhythm, no murmurs. Abdomen: Obese, soft, nontender.  Normoactive  bowel sounds.  No masses or hepatosplenomegaly appreciated.  Well-healed incisions. Extremities: Grossly normal range of motion.  Warm, well perfused.  No edema bilaterally. Skin: No rashes or lesions noted. Lymphatics: No cervical, supraclavicular, or inguinal adenopathy. GU: Normal appearing external genitalia without erythema, excoriation, or lesions.  Speculum exam reveals cuff intact, no visible lesions.  Bimanual exam reveals no nodularity or masses.  Rectovaginal exam confirms these findings.  Laboratory & Radiologic Studies: None new  Assessment & Plan: [name] is a 64 y.o. woman with Stage IA1 grade 1 endometrioid endometrial adenocarcinoma who presents for follow-up. 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 MSI-H. P53 WT.  MLH1 promoter hypermethylation present.  Patient is doing well, NED on exam today.  I had previously recommended meeting with our genetic counselor and undergoing genetic testing.  At her last visit, the  patient expressed anxiety and wanting to wait on this referral.  Reviewed this again today.  Patient would like to hold off on referral to genetics given her anxiety.  She will contact me if she changes her mind before her next visit.  Per [facility] surveillance recommendations, we will continue with visits every 6 months alternating between my office and her OB/GYN.  She will see her OB/GYN in 6 months and return to see us  in 1 year.  I reviewed signs and symptoms that should prompt a phone call before her neck scheduled visit.  20 minutes of total time was spent for this patient encounter, including preparation, face-to-face counseling with the patient and coordination of care, and documentation of the encounter.  Comer Dollar, MD  Division of Gynecologic Oncology  Department of Obstetrics and Gynecology  [facility]      [1]  Current Outpatient Medications:    acetaminophen  (TYLENOL ) 500 MG tablet, Take 500 mg by mouth every 6 (six) hours as needed for moderate pain., Disp: , Rfl:    ALPRAZolam (XANAX) 0.5 MG tablet, Take 0.5 mg 2 (two) times daily as needed by mouth (for anxiety). , Disp: , Rfl:    aspirin EC 81 MG tablet, Take 81 mg by mouth every other day. At bedtime., Disp: , Rfl:    ezetimibe (ZETIA) 10 MG tablet, Take 5 mg by mouth at bedtime., Disp: , Rfl:    fluticasone (FLONASE) 50 MCG/ACT nasal spray, Place 1 spray into both nostrils daily as needed for allergies or rhinitis., Disp: , Rfl:    lactase (LACTAID) 3000 units tablet, Take 3,000 Units by mouth daily as needed (when eating ice cream)., Disp: , Rfl:    lisinopril (PRINIVIL,ZESTRIL) 10 MG tablet, Take 10 mg by mouth daily. , Disp: , Rfl:    simvastatin (ZOCOR) 40 MG tablet, Take 40 mg by mouth at bedtime., Disp: , Rfl:    senna-docusate (SENOKOT-S) 8.6-50 MG tablet, Take 2 tablets by mouth at bedtime. For AFTER surgery, do not take if having diarrhea (Patient not taking: Reported on 11/03/2024),  Disp: 30 tablet, Rfl: 0   traMADol  (ULTRAM ) 50 MG tablet, Take 1 tablet (50 mg total) by mouth every 6 (six) hours as needed for severe pain (pain score 7-10). For AFTER surgery only, do not take and drive (Patient not taking: Reported on 11/03/2024), Disp: 10 tablet, Rfl: 0

## 2025-11-16 ENCOUNTER — Inpatient Hospital Stay: Admitting: Gynecologic Oncology
